# Patient Record
Sex: Female | Born: 1950 | Race: White | Hispanic: No | Marital: Married | Smoking: Never smoker
Health system: Southern US, Community
[De-identification: ages and names within clinical notes are randomized; demographics above are authoritative.]

## PROBLEM LIST (undated history)

## (undated) DIAGNOSIS — I10 Essential (primary) hypertension: Secondary | ICD-10-CM

## (undated) HISTORY — DX: Essential (primary) hypertension: I10

## (undated) HISTORY — PX: CARPAL TUNNEL RELEASE: SHX101

---

## 2005-10-07 ENCOUNTER — Ambulatory Visit: Payer: Self-pay | Admitting: Rheumatology

## 2006-11-24 ENCOUNTER — Ambulatory Visit: Payer: Self-pay | Admitting: Rheumatology

## 2006-12-13 ENCOUNTER — Ambulatory Visit: Payer: Self-pay | Admitting: Internal Medicine

## 2006-12-23 ENCOUNTER — Ambulatory Visit: Payer: Self-pay | Admitting: Internal Medicine

## 2007-01-13 ENCOUNTER — Ambulatory Visit: Payer: Self-pay | Admitting: Internal Medicine

## 2007-02-13 ENCOUNTER — Ambulatory Visit: Payer: Self-pay | Admitting: Internal Medicine

## 2007-03-13 ENCOUNTER — Ambulatory Visit: Payer: Self-pay | Admitting: Internal Medicine

## 2008-06-12 ENCOUNTER — Ambulatory Visit: Payer: Self-pay | Admitting: Internal Medicine

## 2008-06-12 IMAGING — CR DG HAND 2V*L*
1 series · 2 of 2 positions shown · non-contrast
Comparison: none

REASON FOR EXAM: RHEUMATOID ARTHRITIS, PLEASE FAX RESULTS TO [[email or web address]]
COMMENTS:

[Series 1: view not recorded · 0.17mm/px · 2 of 2 slices shown]
[im 1/2]
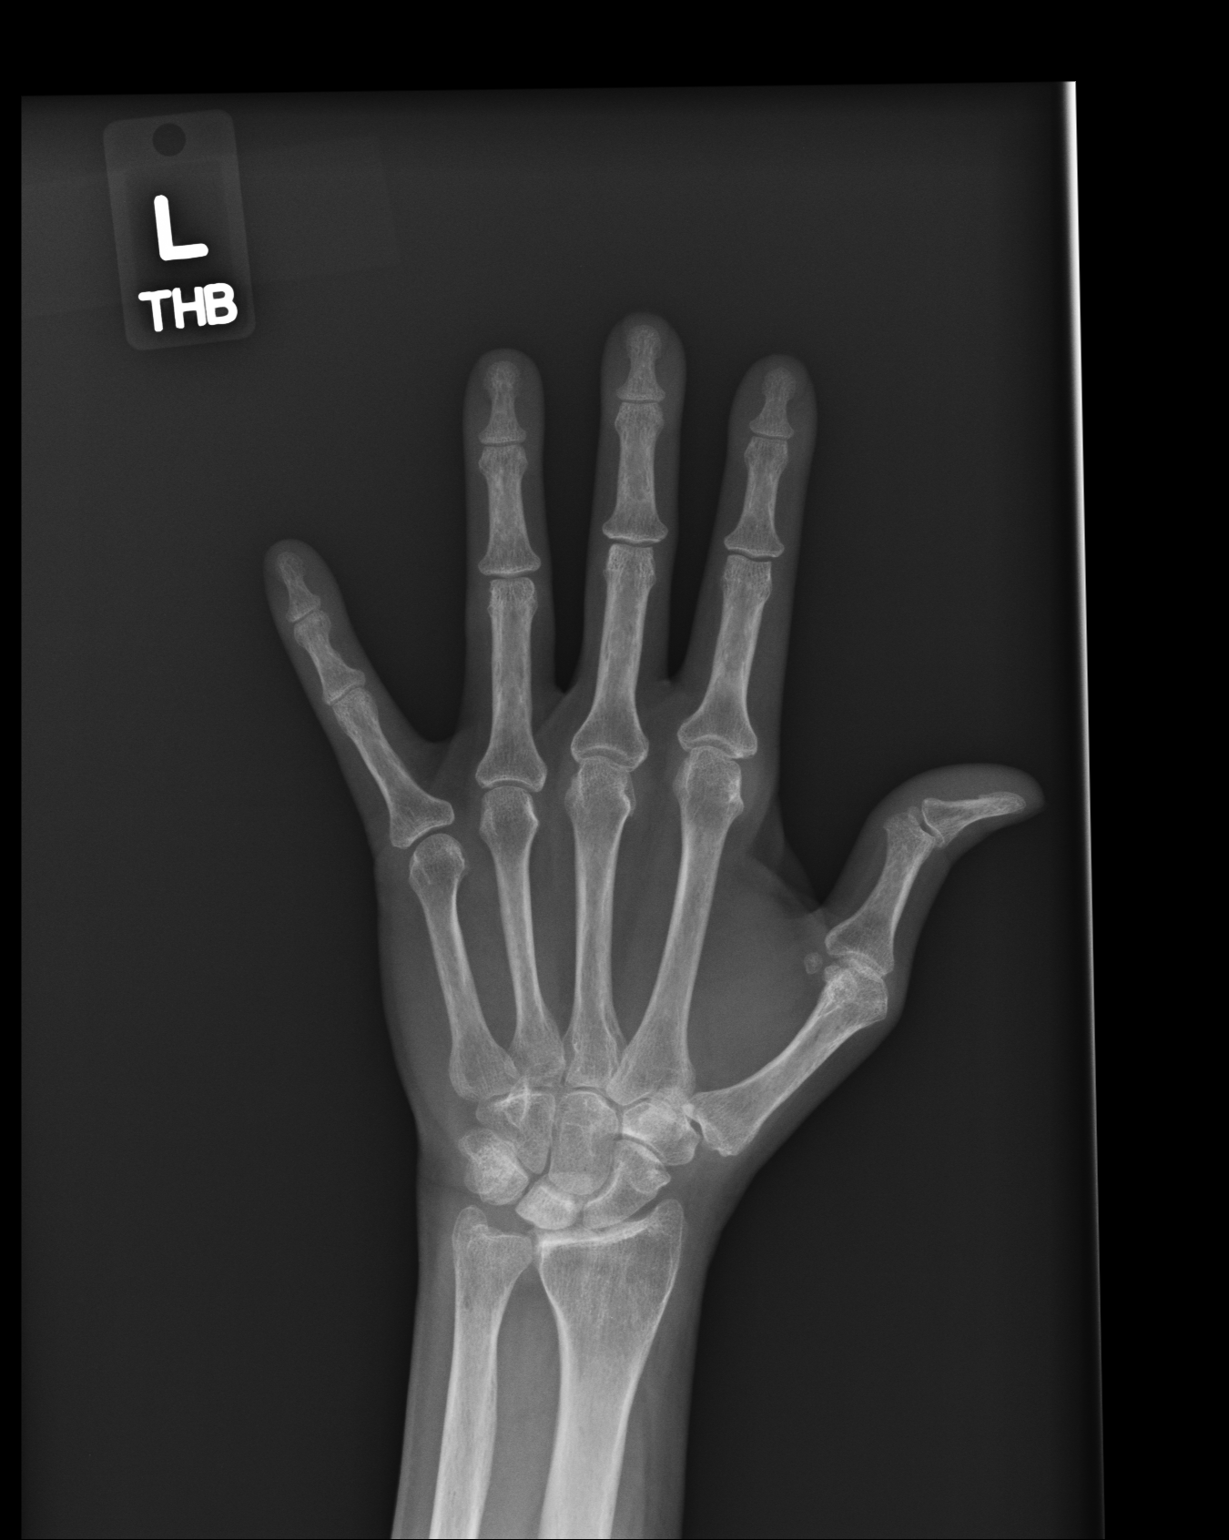
[im 2/2]
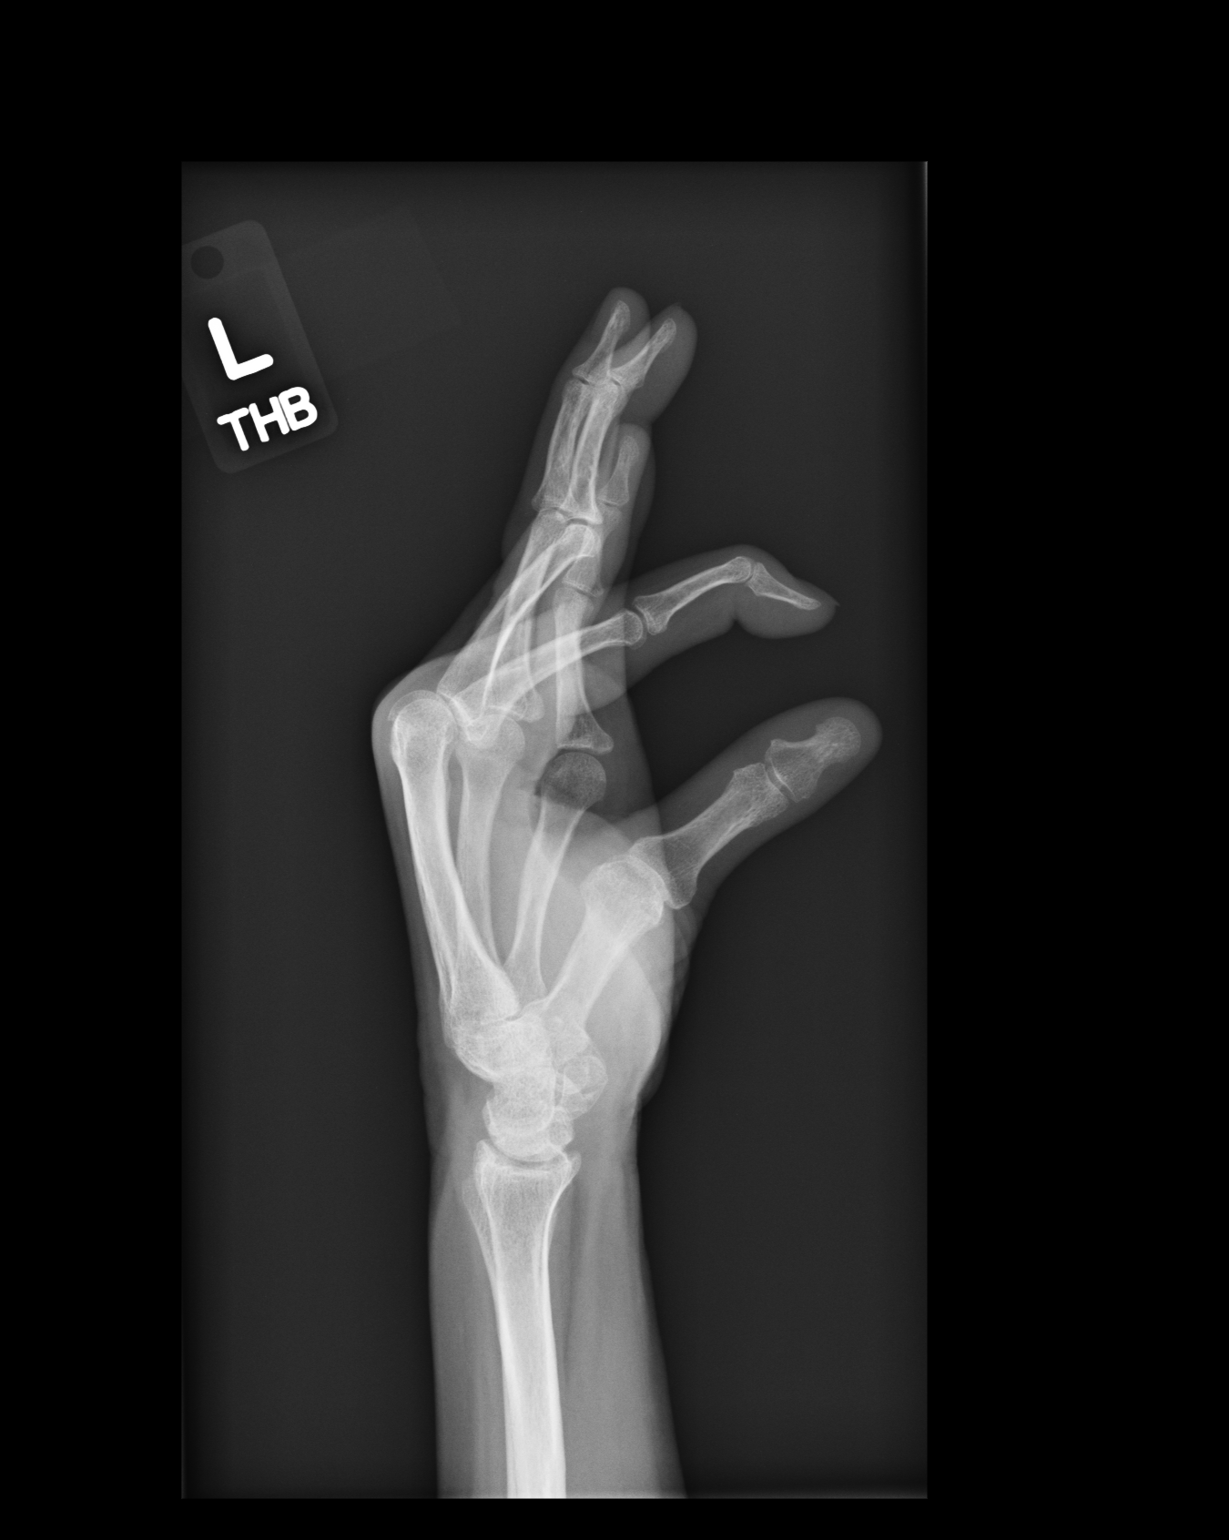

[2 of 2 positions shown; findings below may reference images not displayed]

PROCEDURE:     DXR - DXR HAND LT TWO VIEWS  - [DATE] [DATE]

RESULT:     The bones of the left hand are osteopenic. There is juxta
articular osteopenia involving principally the metacarpophalangeal joints.
There are mild degenerative interphalangeal joint changes noted. Mild
degenerative change of the first and second carpometacarpal joints is
present. The radiocarpal joints appear normal.
IMPRESSION: There is mild juxta articular osteopenia involving
principally the metacarpophalangeal joints. There is overall mild
osteopenia. I do not see bony erosive changes.

## 2008-10-12 HISTORY — PX: TOTAL HIP ARTHROPLASTY: SHX124

## 2008-12-05 ENCOUNTER — Encounter: Payer: Self-pay | Admitting: Orthopedic Surgery

## 2008-12-12 ENCOUNTER — Encounter: Payer: Self-pay | Admitting: Orthopedic Surgery

## 2009-01-12 ENCOUNTER — Encounter: Payer: Self-pay | Admitting: Orthopedic Surgery

## 2009-02-12 ENCOUNTER — Encounter: Payer: Self-pay | Admitting: Orthopedic Surgery

## 2009-06-27 ENCOUNTER — Ambulatory Visit: Payer: Self-pay | Admitting: Internal Medicine

## 2010-08-13 HISTORY — PX: FLAT FOOT RECONSTRUCTION-TAL GASTROC RECESSION: SHX6620

## 2010-11-25 ENCOUNTER — Ambulatory Visit: Payer: Self-pay | Admitting: Internal Medicine

## 2011-05-21 ENCOUNTER — Ambulatory Visit: Payer: Self-pay | Admitting: Internal Medicine

## 2011-08-12 ENCOUNTER — Ambulatory Visit: Payer: Self-pay | Admitting: Internal Medicine

## 2011-12-04 ENCOUNTER — Ambulatory Visit: Payer: Self-pay | Admitting: Internal Medicine

## 2012-01-20 DIAGNOSIS — M810 Age-related osteoporosis without current pathological fracture: Secondary | ICD-10-CM | POA: Insufficient documentation

## 2012-01-20 DIAGNOSIS — M069 Rheumatoid arthritis, unspecified: Secondary | ICD-10-CM | POA: Insufficient documentation

## 2012-01-20 DIAGNOSIS — I7300 Raynaud's syndrome without gangrene: Secondary | ICD-10-CM | POA: Insufficient documentation

## 2012-01-20 DIAGNOSIS — D649 Anemia, unspecified: Secondary | ICD-10-CM | POA: Insufficient documentation

## 2012-11-17 ENCOUNTER — Ambulatory Visit: Payer: Self-pay | Admitting: Internal Medicine

## 2012-11-17 DIAGNOSIS — I059 Rheumatic mitral valve disease, unspecified: Secondary | ICD-10-CM

## 2012-11-24 ENCOUNTER — Ambulatory Visit: Payer: Self-pay | Admitting: Internal Medicine

## 2013-07-31 ENCOUNTER — Ambulatory Visit: Payer: Self-pay | Admitting: Internal Medicine

## 2013-08-21 ENCOUNTER — Ambulatory Visit: Payer: Self-pay | Admitting: Internal Medicine

## 2014-06-12 ENCOUNTER — Other Ambulatory Visit: Payer: Self-pay | Admitting: Internal Medicine

## 2014-06-12 DIAGNOSIS — IMO0002 Reserved for concepts with insufficient information to code with codable children: Secondary | ICD-10-CM

## 2014-06-12 DIAGNOSIS — M810 Age-related osteoporosis without current pathological fracture: Secondary | ICD-10-CM

## 2014-06-22 ENCOUNTER — Ambulatory Visit: Payer: Self-pay

## 2014-06-28 ENCOUNTER — Ambulatory Visit
Admission: RE | Admit: 2014-06-28 | Discharge: 2014-06-28 | Disposition: A | Discharge status: Home / Self Care | Payer: Medicare Other | Source: Ambulatory Visit | Attending: Internal Medicine | Admitting: Internal Medicine

## 2014-06-28 DIAGNOSIS — IMO0002 Reserved for concepts with insufficient information to code with codable children: Secondary | ICD-10-CM

## 2014-06-28 DIAGNOSIS — I7300 Raynaud's syndrome without gangrene: Secondary | ICD-10-CM | POA: Insufficient documentation

## 2014-06-28 DIAGNOSIS — K224 Dyskinesia of esophagus: Secondary | ICD-10-CM | POA: Insufficient documentation

## 2014-06-28 DIAGNOSIS — I340 Nonrheumatic mitral (valve) insufficiency: Secondary | ICD-10-CM | POA: Diagnosis not present

## 2014-06-28 NOTE — Progress Notes (Signed)
*  PRELIMINARY RESULTS* Echocardiogram 2D Echocardiogram   has been performed.  Georgann Housekeeper Hege 06/28/2014, 10:35 AM

## 2014-08-07 ENCOUNTER — Ambulatory Visit (INDEPENDENT_AMBULATORY_CARE_PROVIDER_SITE_OTHER): Payer: Medicare Other | Admitting: Family Medicine

## 2014-08-07 ENCOUNTER — Encounter: Payer: Self-pay | Admitting: Family Medicine

## 2014-08-07 VITALS — BP 120/78 | Temp 98.4°F | Resp 17 | Ht 64.0 in | Wt 113.0 lb

## 2014-08-07 DIAGNOSIS — I10 Essential (primary) hypertension: Secondary | ICD-10-CM

## 2014-08-07 DIAGNOSIS — E039 Hypothyroidism, unspecified: Secondary | ICD-10-CM | POA: Diagnosis not present

## 2014-08-07 DIAGNOSIS — M349 Systemic sclerosis, unspecified: Secondary | ICD-10-CM | POA: Insufficient documentation

## 2014-08-07 DIAGNOSIS — K219 Gastro-esophageal reflux disease without esophagitis: Secondary | ICD-10-CM | POA: Insufficient documentation

## 2014-08-07 NOTE — Progress Notes (Signed)
Name: Susan Small   MRN: [id number]    DOB: 1950-05-14   Date:08/07/2014       Progress Note  Subjective  Chief Complaint  Chief Complaint  Patient presents with  . Follow-up    Blood Work  . Hypertension    Hypertension This is a chronic problem. The problem is controlled. Pertinent negatives include no chest pain, headaches or palpitations. Past treatments include calcium channel blockers. There is no history of angina, kidney disease, CAD/MI or CVA.  Thyroid Problem Presents for follow-up visit. Patient reports no cold intolerance, constipation, depressed mood, fatigue or palpitations. The symptoms have been stable. Past treatments include levothyroxine. There is no history of Graves' ophthalmopathy.      Past Medical History  Diagnosis Date  . Hypertension     Past Surgical History  Procedure Laterality Date  . Flat foot reconstruction-tal gastroc recession  08/2010  . Total hip arthroplasty Right 10/2008  . Carpal tunnel release Right     Family History  Problem Relation Age of Onset  . Hypertension Mother     History   Social History  . Marital Status: Married    Spouse Name: N/A  . Number of Children: N/A  . Years of Education: N/A   Occupational History  . Not on file.   Social History Main Topics  . Smoking status: Never Smoker   . Smokeless tobacco: Never Used  . Alcohol Use: No  . Drug Use: No  . Sexual Activity: No   Other Topics Concern  . Not on file   Social History Narrative  . No narrative on file     Current outpatient prescriptions:  .  amLODipine (NORVASC) 5 MG tablet, Take 5 mg by mouth daily., Disp: , Rfl: 0 .  hydroxychloroquine (PLAQUENIL) 200 MG tablet, Take 400 mg by mouth daily., Disp: , Rfl: 3 .  levothyroxine (SYNTHROID, LEVOTHROID) 88 MCG tablet, Take 88 mcg by mouth daily., Disp: , Rfl: 3  Allergies  Allergen Reactions  . Chlorzoxazone Rash  . Tetanus Toxoid Anxiety    Other Reaction: Other reaction  . Tetanus  Toxoids      Review of Systems  Constitutional: Negative for fatigue.  Cardiovascular: Negative for chest pain and palpitations.  Gastrointestinal: Negative for constipation.  Neurological: Negative for headaches.  Endo/Heme/Allergies: Negative for cold intolerance.      Objective  Filed Vitals:   08/07/14 0810  BP: 120/78  Temp: 98.4 F (36.9 C)  TempSrc: Oral  Resp: 17  Height: 5\' 4"  (1.626 m)  Weight: 113 lb (51.256 kg)    Physical Exam  Constitutional: She is well-developed, well-nourished, and in no distress.  Neck: No thyroid mass and no thyromegaly present.  Cardiovascular: Normal rate and regular rhythm.   Pulmonary/Chest: Effort normal and breath sounds normal.  Musculoskeletal: She exhibits no edema.  Nursing note and vitals reviewed.     Assessment & Plan 1. Essential hypertension Blood pressure is well controlled on amlodipine 5 mg daily. Continue present management. Laboratory follow-up obtained. - Lipid panel - CBC w/Diff  2. Hypothyroidism, unspecified hypothyroidism type Recheck TSH and free T4 and adjust medication accordingly. - TSH - T4, free   Shavaun Osterloh Asad A. [facility] 08/07/2014 8:25 AM

## 2014-08-08 LAB — CBC WITH DIFFERENTIAL/PLATELET
Basophils Absolute: 0 10*3/uL (ref 0.0–0.2)
Basos: 1 %
EOS (ABSOLUTE): 0.2 10*3/uL (ref 0.0–0.4)
Eos: 5 %
Hematocrit: 38.7 % (ref 34.0–46.6)
Hemoglobin: 12.7 g/dL (ref 11.1–15.9)
IMMATURE GRANS (ABS): 0 10*3/uL (ref 0.0–0.1)
Immature Granulocytes: 0 %
LYMPHS: 30 %
Lymphocytes Absolute: 1.1 10*3/uL (ref 0.7–3.1)
MCH: 30.5 pg (ref 26.6–33.0)
MCHC: 32.8 g/dL (ref 31.5–35.7)
MCV: 93 fL (ref 79–97)
Monocytes Absolute: 0.6 10*3/uL (ref 0.1–0.9)
Monocytes: 17 %
NEUTROS ABS: 1.8 10*3/uL (ref 1.4–7.0)
Neutrophils: 47 %
PLATELETS: 220 10*3/uL (ref 150–379)
RBC: 4.17 x10E6/uL (ref 3.77–5.28)
RDW: 14.3 % (ref 12.3–15.4)
WBC: 3.7 10*3/uL (ref 3.4–10.8)

## 2014-08-08 LAB — LIPID PANEL
CHOLESTEROL TOTAL: 149 mg/dL (ref 100–199)
Chol/HDL Ratio: 3.4 ratio units (ref 0.0–4.4)
HDL: 44 mg/dL (ref >39)
LDL Calculated: 91 mg/dL (ref 0–99)
TRIGLYCERIDES: 72 mg/dL (ref 0–149)
VLDL CHOLESTEROL CAL: 14 mg/dL (ref 5–40)

## 2014-08-08 LAB — T4, FREE: Free T4: 1.74 ng/dL (ref 0.82–1.77)

## 2014-08-08 LAB — TSH: TSH: 0.158 u[IU]/mL — ABNORMAL LOW (ref 0.450–4.500)

## 2014-08-10 ENCOUNTER — Other Ambulatory Visit: Payer: Self-pay | Admitting: Family Medicine

## 2014-08-10 MED ORDER — LEVOTHYROXINE SODIUM 75 MCG PO TABS: 75.0000 ug | ORAL_TABLET | Freq: Every day | ORAL | Status: DC

## 2014-08-10 NOTE — Telephone Encounter (Signed)
Medication has been filled and sent to [facility]

## 2014-09-05 ENCOUNTER — Other Ambulatory Visit: Payer: Self-pay | Admitting: Family Medicine

## 2014-09-07 ENCOUNTER — Telehealth: Payer: Self-pay | Admitting: Family Medicine

## 2014-09-07 MED ORDER — LEVOTHYROXINE SODIUM 75 MCG PO TABS: 75.0000 ug | ORAL_TABLET | Freq: Every day | ORAL | Status: DC

## 2014-09-07 NOTE — Telephone Encounter (Signed)
Pt would like a all back about her thyroid meds.

## 2014-09-07 NOTE — Telephone Encounter (Signed)
Levothyroxine 75 MCG has been refilled and sent to [facility]

## 2014-10-03 ENCOUNTER — Telehealth: Payer: Self-pay | Admitting: Family Medicine

## 2014-10-03 NOTE — Telephone Encounter (Signed)
PT ISD ASKING FOR A LAB ORDER FOR THYROIDS TO SEE IF THEY ARE CORRECT SINCE THE DR CHANGED THE STRENGTH OF HER MEDS. IS ASKING THAT THIS ORDER IF COULD BE SENT TO [facility]

## 2014-10-05 MED ORDER — LEVOTHYROXINE SODIUM 75 MCG PO TABS: 75.0000 ug | ORAL_TABLET | Freq: Every day | ORAL | Status: DC

## 2014-10-05 NOTE — Telephone Encounter (Signed)
Medication has been refilled and sent to [facility] [address] 

## 2014-10-07 NOTE — Telephone Encounter (Signed)
Please schedule patient for an appointment to check TSH and free T4 levels.

## 2014-10-08 NOTE — Telephone Encounter (Signed)
PT IS ASKING WHY SHE IS HAVING TO HAVE AN OFFICE VISIT FOR THIS. SHE SAID THAT SHE HAD A PANEL DONE 6  WKS AGO. PLEASE CALL HER SHE IS REFUSING FOR ME TO MAKE HER ANOTHER APPT TILL SHE TALKS TO SOMEONE.

## 2014-10-31 ENCOUNTER — Telehealth: Payer: Self-pay | Admitting: Family Medicine

## 2014-10-31 DIAGNOSIS — E039 Hypothyroidism, unspecified: Secondary | ICD-10-CM

## 2014-10-31 NOTE — Telephone Encounter (Signed)
PT IS NEEDING LAB ORDERS TO GET THYROID CHECKED. SHE HAS ONLY ENOUGH TO LAST 1 WK AND YOU ALL WANTED TO MAKE SURE SHE IS ON THE RIGHT STRENGTH. ALSO WANTS THIS FAXED TO [facility] ON [address]. SHE IS AROUND THE CORNER FROM THEM.

## 2014-10-31 NOTE — Telephone Encounter (Signed)
Spoke with patient and she will come to office on tomorrow morning 11/01/2014 to pick up lab orders

## 2014-11-01 NOTE — Telephone Encounter (Signed)
Lab orders are ready for patient to pick up and she has been notified

## 2014-11-02 LAB — T4, FREE: FREE T4: 1.39 ng/dL (ref 0.82–1.77)

## 2014-11-02 LAB — TSH: TSH: 0.509 u[IU]/mL (ref 0.450–4.500)

## 2014-11-06 ENCOUNTER — Other Ambulatory Visit: Payer: Self-pay | Admitting: Family Medicine

## 2014-12-03 ENCOUNTER — Other Ambulatory Visit: Payer: Self-pay | Admitting: Family Medicine

## 2014-12-05 ENCOUNTER — Other Ambulatory Visit: Payer: Self-pay | Admitting: Family Medicine

## 2014-12-05 ENCOUNTER — Telehealth: Payer: Self-pay | Admitting: Family Medicine

## 2014-12-05 MED ORDER — AMLODIPINE BESYLATE 5 MG PO TABS: 5.0000 mg | ORAL_TABLET | Freq: Every day | ORAL | Status: DC

## 2014-12-05 NOTE — Telephone Encounter (Signed)
Requesting refill on amlodipine and levothyroxine. Requesting that it be sent to [facility] and that you please send in enough of both medications to last until her next appointment in January. I did inform her that the levothyroxine was sent on the 21st however the pharmacy did not receive it

## 2014-12-05 NOTE — Telephone Encounter (Signed)
Medication has been refilled and sent to [facility] [address] 

## 2014-12-27 ENCOUNTER — Ambulatory Visit (INDEPENDENT_AMBULATORY_CARE_PROVIDER_SITE_OTHER): Payer: Medicare Other | Admitting: Family Medicine

## 2014-12-27 ENCOUNTER — Encounter: Payer: Self-pay | Admitting: Family Medicine

## 2014-12-27 VITALS — BP 120/76 | Temp 97.9°F | Ht 64.0 in | Wt 114.1 lb

## 2014-12-27 DIAGNOSIS — E039 Hypothyroidism, unspecified: Secondary | ICD-10-CM

## 2014-12-27 DIAGNOSIS — I10 Essential (primary) hypertension: Secondary | ICD-10-CM | POA: Diagnosis not present

## 2014-12-27 MED ORDER — AMLODIPINE BESYLATE 5 MG PO TABS: 5.0000 mg | ORAL_TABLET | Freq: Every day | ORAL | Status: DC

## 2014-12-27 MED ORDER — LEVOTHYROXINE SODIUM 75 MCG PO TABS: 75.0000 ug | ORAL_TABLET | Freq: Every day | ORAL | Status: DC

## 2014-12-27 NOTE — Progress Notes (Signed)
Name: Susan Small   MRN: [id number]    DOB: 1950-04-07   Date:12/27/2014       Progress Note  Subjective  Chief Complaint  Chief Complaint  Patient presents with  . Medication Refill    amlodipine 5 mg / levothyroxine  . Hypertension  . Gastroesophageal Reflux    Hypertension This is a chronic problem. The problem is controlled. Pertinent negatives include no blurred vision, chest pain, headaches, palpitations or shortness of breath. Past treatments include calcium channel blockers. Hypertensive end-organ damage includes a thyroid problem. There is no history of angina, kidney disease, CAD/MI or CVA.  Thyroid Problem Presents for follow-up visit. Patient reports no cold intolerance, constipation, depressed mood, fatigue, hair loss, hoarse voice or palpitations. The symptoms have been stable. Past treatments include levothyroxine. There is no history of Graves' ophthalmopathy.     Past Medical History  Diagnosis Date  . Hypertension     Past Surgical History  Procedure Laterality Date  . Flat foot reconstruction-tal gastroc recession  08/2010  . Total hip arthroplasty Right 10/2008  . Carpal tunnel release Right     Family History  Problem Relation Age of Onset  . Hypertension Mother     Social History   Social History  . Marital Status: Married    Spouse Name: N/A  . Number of Children: N/A  . Years of Education: N/A   Occupational History  . Not on file.   Social History Main Topics  . Smoking status: Never Smoker   . Smokeless tobacco: Never Used  . Alcohol Use: No  . Drug Use: No  . Sexual Activity: No   Other Topics Concern  . Not on file   Social History Narrative     Current outpatient prescriptions:  .  amLODipine (NORVASC) 5 MG tablet, Take 1 tablet (5 mg total) by mouth daily., Disp: 30 tablet, Rfl: 0 .  hydroxychloroquine (PLAQUENIL) 200 MG tablet, Take 400 mg by mouth daily., Disp: , Rfl: 3 .  levothyroxine (SYNTHROID, LEVOTHROID) 75  MCG tablet, TAKE 1 TABLET (75 MCG TOTAL) BY MOUTH DAILY., Disp: 30 tablet, Rfl: 0  Allergies  Allergen Reactions  . Chlorzoxazone Rash  . Tetanus Toxoid Anxiety and Other (See Comments)    Other Reaction: Other reaction Other Reaction: Other reaction  . Tetanus Toxoids      Review of Systems  Constitutional: Negative for fatigue.  HENT: Negative for hoarse voice.   Eyes: Negative for blurred vision.  Respiratory: Negative for shortness of breath.   Cardiovascular: Negative for chest pain and palpitations.  Gastrointestinal: Negative for constipation.  Neurological: Negative for headaches.  Endo/Heme/Allergies: Negative for cold intolerance.     Objective  Filed Vitals:   12/27/14 0841  BP: 120/76  Temp: 97.9 F (36.6 C)  TempSrc: Oral  Height: 5\' 4"  (1.626 m)  Weight: 114 lb 1.6 oz (51.755 kg)    Physical Exam  Constitutional: She is well-developed, well-nourished, and in no distress.  Neck: No thyroid mass and no thyromegaly present.  Cardiovascular: Normal rate and regular rhythm.   Pulmonary/Chest: Effort normal and breath sounds normal.  Musculoskeletal: She exhibits no edema.  Nursing note and vitals reviewed.    Recent Results (from the past 2160 hour(s))  T4, free     Status: None   Collection Time: 11/01/14  9:54 AM  Result Value Ref Range   Free T4 1.39 0.82 - 1.77 ng/dL  TSH     Status: None   Collection  Time: 11/01/14  9:54 AM  Result Value Ref Range   TSH 0.509 0.450 - 4.500 uIU/mL     Assessment & Plan  1. Essential hypertension  - amLODipine (NORVASC) 5 MG tablet; Take 1 tablet (5 mg total) by mouth daily.  Dispense: 90 tablet; Refill: 1  2. Hypothyroidism, unspecified hypothyroidism type  - levothyroxine (SYNTHROID, LEVOTHROID) 75 MCG tablet; Take 1 tablet (75 mcg total) by mouth daily before breakfast.  Dispense: 90 tablet; Refill: 1   Cymone Yeske Asad A[facility] 12/27/2014 8:47 AM

## 2015-02-08 ENCOUNTER — Ambulatory Visit: Payer: Medicare Other | Admitting: Family Medicine

## 2015-06-20 ENCOUNTER — Other Ambulatory Visit (HOSPITAL_COMMUNITY): Payer: Self-pay | Admitting: Internal Medicine

## 2015-06-20 ENCOUNTER — Other Ambulatory Visit (HOSPITAL_COMMUNITY): Payer: Self-pay | Admitting: Respiratory Therapy

## 2015-06-20 ENCOUNTER — Telehealth: Payer: Self-pay | Admitting: Family Medicine

## 2015-06-20 DIAGNOSIS — I272 Pulmonary hypertension, unspecified: Secondary | ICD-10-CM

## 2015-06-20 DIAGNOSIS — I10 Essential (primary) hypertension: Secondary | ICD-10-CM

## 2015-06-20 DIAGNOSIS — J849 Interstitial pulmonary disease, unspecified: Secondary | ICD-10-CM

## 2015-06-20 DIAGNOSIS — E039 Hypothyroidism, unspecified: Secondary | ICD-10-CM

## 2015-06-20 MED ORDER — LEVOTHYROXINE SODIUM 75 MCG PO TABS: 75.0000 ug | ORAL_TABLET | Freq: Every day | ORAL | Status: DC

## 2015-06-20 MED ORDER — AMLODIPINE BESYLATE 5 MG PO TABS: 5.0000 mg | ORAL_TABLET | Freq: Every day | ORAL | Status: DC

## 2015-06-20 NOTE — Telephone Encounter (Signed)
Have appointment for 07-11-15 but is needing a refill on amlodipine and levothyroxin. Please send to [facility] [address]

## 2015-06-20 NOTE — Telephone Encounter (Signed)
Prescription for amlodipine and levothyroxine has been sent to her pharmacy

## 2015-06-21 NOTE — Telephone Encounter (Signed)
She picked up her prescriptions on yesterday.

## 2015-06-25 ENCOUNTER — Other Ambulatory Visit (HOSPITAL_COMMUNITY): Payer: Medicare Other

## 2015-06-26 ENCOUNTER — Other Ambulatory Visit: Payer: Self-pay | Admitting: Internal Medicine

## 2015-06-26 DIAGNOSIS — J849 Interstitial pulmonary disease, unspecified: Secondary | ICD-10-CM

## 2015-07-02 ENCOUNTER — Encounter (HOSPITAL_COMMUNITY): Payer: Medicare Other

## 2015-07-09 ENCOUNTER — Ambulatory Visit (HOSPITAL_COMMUNITY): Payer: Medicare Other

## 2015-07-09 ENCOUNTER — Ambulatory Visit
Admission: RE | Admit: 2015-07-09 | Discharge: 2015-07-09 | Disposition: A | Discharge status: Home / Self Care | Payer: Medicare Other | Source: Ambulatory Visit | Attending: Internal Medicine | Admitting: Internal Medicine

## 2015-07-09 DIAGNOSIS — I272 Other secondary pulmonary hypertension: Secondary | ICD-10-CM | POA: Diagnosis not present

## 2015-07-09 DIAGNOSIS — I340 Nonrheumatic mitral (valve) insufficiency: Secondary | ICD-10-CM | POA: Diagnosis not present

## 2015-07-09 DIAGNOSIS — J849 Interstitial pulmonary disease, unspecified: Secondary | ICD-10-CM

## 2015-07-09 LAB — ECHOCARDIOGRAM COMPLETE
AV peak Index: 2.06
AVAREAVTI: 3.17 cm2
AVPG: 5 mmHg
AVPKVEL: 109 cm/s
Ao pk vel: 1.01 m/s
CHL CUP MV DEC (S): 280
E decel time: 280 msec
E/e' ratio: 8.12
FS: 44 % (ref 28–44)
IVS/LV PW RATIO, ED: 0.96
LA ID, A-P, ES: 36 mm
LA vol index: 41.5 mL/m2
LADIAMINDEX: 2.34 cm/m2
LAVOL: 63.9 mL
LAVOLA4C: 59.8 mL
LEFT ATRIUM END SYS DIAM: 36 mm
LV E/e' medial: 8.12
LV E/e'average: 8.12
LV TDI E'MEDIAL: 5.11
LVELAT: 10.1 cm/s
LVOT area: 3.14 cm2
LVOT diameter: 20 mm
LVOT peak vel: 110 cm/s
MV pk E vel: 82 m/s
MVPG: 3 mmHg
MVPKAVEL: 76.6 m/s
PW: 12 mm — AB (ref 0.6–1.1)
RV TAPSE: 17.4 mm
TDI e' lateral: 10.1

## 2015-07-09 NOTE — Progress Notes (Signed)
*  PRELIMINARY RESULTS* Echocardiogram 2D Echocardiogram has been performed.  Susan Small 07/09/2015, 10:21 AM

## 2015-07-11 ENCOUNTER — Ambulatory Visit (INDEPENDENT_AMBULATORY_CARE_PROVIDER_SITE_OTHER): Payer: Medicare Other | Admitting: Family Medicine

## 2015-07-11 ENCOUNTER — Encounter: Payer: Self-pay | Admitting: Family Medicine

## 2015-07-11 VITALS — BP 132/70 | HR 89 | Temp 98.4°F | Resp 16 | Ht 64.0 in | Wt 107.8 lb

## 2015-07-11 DIAGNOSIS — I10 Essential (primary) hypertension: Secondary | ICD-10-CM | POA: Diagnosis not present

## 2015-07-11 DIAGNOSIS — E039 Hypothyroidism, unspecified: Secondary | ICD-10-CM | POA: Diagnosis not present

## 2015-07-11 DIAGNOSIS — Z13220 Encounter for screening for lipoid disorders: Secondary | ICD-10-CM

## 2015-07-11 DIAGNOSIS — E785 Hyperlipidemia, unspecified: Secondary | ICD-10-CM | POA: Insufficient documentation

## 2015-07-11 NOTE — Progress Notes (Signed)
Name: Susan Small   MRN: [id number]    DOB: 1950-10-14   Date:07/11/2015       Progress Note  Subjective  Chief Complaint  Chief Complaint  Patient presents with  . Follow-up  . Hyperlipidemia  . Hypothyroidism    Hypertension This is a chronic problem. The problem is controlled. Associated symptoms include shortness of breath (Has scleroderma). Pertinent negatives include no chest pain, headaches or palpitations. Past treatments include calcium channel blockers. Hypertensive end-organ damage includes a thyroid problem.  Thyroid Problem Presents for follow-up visit. Symptoms include cold intolerance. Patient reports no depressed mood, dry skin, fatigue or palpitations. Past treatments include levothyroxine.     Past Medical History  Diagnosis Date  . Hypertension     Past Surgical History  Procedure Laterality Date  . Flat foot reconstruction-tal gastroc recession  08/2010  . Total hip arthroplasty Right 10/2008  . Carpal tunnel release Right     Family History  Problem Relation Age of Onset  . Hypertension Mother     Social History   Social History  . Marital Status: Married    Spouse Name: N/A  . Number of Children: N/A  . Years of Education: N/A   Occupational History  . Not on file.   Social History Main Topics  . Smoking status: Never Smoker   . Smokeless tobacco: Never Used  . Alcohol Use: No  . Drug Use: No  . Sexual Activity: No   Other Topics Concern  . Not on file   Social History Narrative     Current outpatient prescriptions:  .  amLODipine (NORVASC) 5 MG tablet, Take 1 tablet (5 mg total) by mouth daily., Disp: 90 tablet, Rfl: 1 .  hydroxychloroquine (PLAQUENIL) 200 MG tablet, Take 400 mg by mouth daily., Disp: , Rfl: 3 .  levothyroxine (SYNTHROID, LEVOTHROID) 75 MCG tablet, Take 1 tablet (75 mcg total) by mouth daily before breakfast., Disp: 90 tablet, Rfl: 1  Allergies  Allergen Reactions  . Chlorzoxazone Rash  . Tetanus Toxoid  Anxiety and Other (See Comments)    Other Reaction: Other reaction Other Reaction: Other reaction  . Tetanus Toxoids      Review of Systems  Constitutional: Negative for fatigue.  Respiratory: Positive for shortness of breath (Has scleroderma).   Cardiovascular: Negative for chest pain and palpitations.  Neurological: Negative for headaches.  Endo/Heme/Allergies: Positive for cold intolerance.     Objective  Filed Vitals:   07/11/15 0905  BP: 132/70  Pulse: 89  Temp: 98.4 F (36.9 C)  TempSrc: Oral  Resp: 16  Height: 5\' 4"  (1.626 m)  Weight: 107 lb 12.8 oz (48.898 kg)  SpO2: 98%    Physical Exam  Constitutional: She is oriented to person, place, and time and well-developed, well-nourished, and in no distress.  HENT:  Head: Normocephalic and atraumatic.  Neck: No thyroid mass and no thyromegaly present.  Musculoskeletal:       Right ankle: She exhibits no swelling.       Left ankle: She exhibits no swelling.  Neurological: She is alert and oriented to person, place, and time.  Psychiatric: Mood, memory, affect and judgment normal.  Nursing note and vitals reviewed.      Assessment & Plan  1. Essential hypertension BP stable and controlled - Comprehensive Metabolic Panel (CMET)  2. Hypothyroidism, unspecified hypothyroidism type  - TSH  3. Screening for lipid disorders  - Lipid Profile  Deolinda Frid [facility] 07/11/2015 9:11 AM

## 2015-07-12 LAB — COMPREHENSIVE METABOLIC PANEL
ALBUMIN: 4.1 g/dL (ref 3.6–4.8)
ALK PHOS: 56 IU/L (ref 39–117)
ALT: 7 IU/L (ref 0–32)
AST: 30 IU/L (ref 0–40)
Albumin/Globulin Ratio: 0.9 — ABNORMAL LOW (ref 1.2–2.2)
BILIRUBIN TOTAL: 0.5 mg/dL (ref 0.0–1.2)
BUN / CREAT RATIO: 12 (ref 12–28)
BUN: 11 mg/dL (ref 8–27)
CHLORIDE: 102 mmol/L (ref 96–106)
CO2: 25 mmol/L (ref 18–29)
Calcium: 9.6 mg/dL (ref 8.7–10.3)
Creatinine, Ser: 0.94 mg/dL (ref 0.57–1.00)
GFR calc Af Amer: 74 mL/min/{1.73_m2} (ref >59)
GFR calc non Af Amer: 64 mL/min/{1.73_m2} (ref >59)
GLOBULIN, TOTAL: 4.6 g/dL — AB (ref 1.5–4.5)
GLUCOSE: 87 mg/dL (ref 65–99)
Potassium: 4.2 mmol/L (ref 3.5–5.2)
SODIUM: 141 mmol/L (ref 134–144)
Total Protein: 8.7 g/dL — ABNORMAL HIGH (ref 6.0–8.5)

## 2015-07-12 LAB — LIPID PANEL
Chol/HDL Ratio: 3.3 ratio units (ref 0.0–4.4)
Cholesterol, Total: 160 mg/dL (ref 100–199)
HDL: 49 mg/dL (ref >39)
LDL Calculated: 100 mg/dL — ABNORMAL HIGH (ref 0–99)
Triglycerides: 53 mg/dL (ref 0–149)
VLDL CHOLESTEROL CAL: 11 mg/dL (ref 5–40)

## 2015-07-12 LAB — TSH: TSH: 1.33 u[IU]/mL (ref 0.450–4.500)

## 2015-08-19 ENCOUNTER — Other Ambulatory Visit: Payer: Self-pay | Admitting: Internal Medicine

## 2015-08-19 DIAGNOSIS — M818 Other osteoporosis without current pathological fracture: Secondary | ICD-10-CM

## 2015-09-11 ENCOUNTER — Ambulatory Visit
Admission: RE | Admit: 2015-09-11 | Discharge: 2015-09-11 | Disposition: A | Discharge status: Home / Self Care | Payer: Medicare Other | Source: Ambulatory Visit | Attending: Internal Medicine | Admitting: Internal Medicine

## 2015-09-11 DIAGNOSIS — M818 Other osteoporosis without current pathological fracture: Secondary | ICD-10-CM

## 2015-09-11 DIAGNOSIS — M8580 Other specified disorders of bone density and structure, unspecified site: Secondary | ICD-10-CM | POA: Insufficient documentation

## 2015-09-11 DIAGNOSIS — Z13820 Encounter for screening for osteoporosis: Secondary | ICD-10-CM | POA: Insufficient documentation

## 2015-10-22 ENCOUNTER — Telehealth: Payer: Self-pay | Admitting: Family Medicine

## 2015-10-22 NOTE — Telephone Encounter (Signed)
Would like to know the last dosage of the levothyroxine. She called the pharmacy to request a refill on the 75 mcg but she think it should be the 88 mcg. Please clarify.

## 2015-10-22 NOTE — Telephone Encounter (Signed)
According to her chart, she should be on levothyroxine 75 g daily

## 2015-10-23 NOTE — Telephone Encounter (Signed)
Pt informed and scheduled medication refill appointment for 01/23/15

## 2015-12-27 ENCOUNTER — Other Ambulatory Visit: Payer: Self-pay | Admitting: Family Medicine

## 2015-12-27 DIAGNOSIS — I10 Essential (primary) hypertension: Secondary | ICD-10-CM

## 2016-01-23 ENCOUNTER — Telehealth: Payer: Self-pay | Admitting: Family Medicine

## 2016-01-23 ENCOUNTER — Ambulatory Visit: Payer: Medicare Other | Admitting: Family Medicine

## 2016-01-23 DIAGNOSIS — E039 Hypothyroidism, unspecified: Secondary | ICD-10-CM

## 2016-01-23 MED ORDER — LEVOTHYROXINE SODIUM 75 MCG PO TABS: 75.0000 ug | ORAL_TABLET | Freq: Every day | ORAL | 1 refills | Status: DC

## 2016-01-23 NOTE — Telephone Encounter (Signed)
PT IS NEEDING REFILL ON HER THYROID MEDICATION. [facility] [address]. SHE HAS AN APPT ON JAN 25TH. PT IS TOTALLY OUT

## 2016-01-23 NOTE — Telephone Encounter (Signed)
Prescription for Synthroid sent to pharmacy   

## 2016-02-06 ENCOUNTER — Ambulatory Visit (INDEPENDENT_AMBULATORY_CARE_PROVIDER_SITE_OTHER): Payer: Medicare Other | Admitting: Family Medicine

## 2016-02-06 ENCOUNTER — Encounter: Payer: Self-pay | Admitting: Family Medicine

## 2016-02-06 VITALS — BP 140/82 | HR 85 | Temp 98.2°F | Resp 16 | Ht 64.0 in | Wt 109.3 lb

## 2016-02-06 DIAGNOSIS — I10 Essential (primary) hypertension: Secondary | ICD-10-CM | POA: Diagnosis not present

## 2016-02-06 DIAGNOSIS — E039 Hypothyroidism, unspecified: Secondary | ICD-10-CM | POA: Diagnosis not present

## 2016-02-06 DIAGNOSIS — Z13220 Encounter for screening for lipoid disorders: Secondary | ICD-10-CM | POA: Diagnosis not present

## 2016-02-06 DIAGNOSIS — M349 Systemic sclerosis, unspecified: Secondary | ICD-10-CM | POA: Diagnosis not present

## 2016-02-06 LAB — COMPLETE METABOLIC PANEL WITH GFR
ALBUMIN: 3.9 g/dL (ref 3.6–5.1)
ALK PHOS: 48 U/L (ref 33–130)
ALT: 11 U/L (ref 6–29)
AST: 31 U/L (ref 10–35)
BILIRUBIN TOTAL: 0.5 mg/dL (ref 0.2–1.2)
BUN: 12 mg/dL (ref 7–25)
CO2: 24 mmol/L (ref 20–31)
CREATININE: 0.75 mg/dL (ref 0.50–0.99)
Calcium: 9.5 mg/dL (ref 8.6–10.4)
Chloride: 105 mmol/L (ref 98–110)
GFR, EST NON AFRICAN AMERICAN: 85 mL/min (ref >=60)
GLUCOSE: 86 mg/dL (ref 65–99)
Potassium: 4.4 mmol/L (ref 3.5–5.3)
Sodium: 139 mmol/L (ref 135–146)
TOTAL PROTEIN: 8.8 g/dL — AB (ref 6.1–8.1)

## 2016-02-06 LAB — LIPID PANEL
Cholesterol: 153 mg/dL (ref <200)
HDL: 44 mg/dL — AB (ref >50)
LDL Cholesterol: 97 mg/dL (ref <100)
TRIGLYCERIDES: 58 mg/dL (ref <150)
Total CHOL/HDL Ratio: 3.5 Ratio (ref <5.0)
VLDL: 12 mg/dL (ref <30)

## 2016-02-06 LAB — TSH: TSH: 1.84 mIU/L

## 2016-02-06 MED ORDER — AMLODIPINE BESYLATE 5 MG PO TABS: 5.0000 mg | ORAL_TABLET | Freq: Every day | ORAL | 1 refills | Status: DC

## 2016-02-06 MED ORDER — LEVOTHYROXINE SODIUM 75 MCG PO TABS: 75.0000 ug | ORAL_TABLET | Freq: Every day | ORAL | 1 refills | Status: DC

## 2016-02-06 NOTE — Progress Notes (Signed)
Name: Susan Small   MRN: [id number]    DOB: 1950/04/29   Date:02/06/2016       Progress Note  Subjective  Chief Complaint  Chief Complaint  Patient presents with  . Hypertension    folloow up, medication refills  . Hypothyroidism    Hypertension  This is a chronic problem. The problem is unchanged. The problem is controlled. Associated symptoms include shortness of breath (Has scleroderma). Pertinent negatives include no blurred vision, chest pain, headaches or palpitations. Past treatments include calcium channel blockers. There is no history of kidney disease, CAD/MI or CVA. Identifiable causes of hypertension include a thyroid problem.  Thyroid Problem  Presents for follow-up visit. Symptoms include cold intolerance. Patient reports no constipation, depressed mood, dry skin, fatigue, hair loss or palpitations. The symptoms have been stable. Past treatments include levothyroxine.     Past Medical History:  Diagnosis Date  . Hypertension     Past Surgical History:  Procedure Laterality Date  . CARPAL TUNNEL RELEASE Right   . FLAT FOOT RECONSTRUCTION-TAL GASTROC RECESSION  08/2010  . TOTAL HIP ARTHROPLASTY Right 10/2008    Family History  Problem Relation Age of Onset  . Hypertension Mother     Social History   Social History  . Marital status: Married    Spouse name: N/A  . Number of children: N/A  . Years of education: N/A   Occupational History  . Not on file.   Social History Main Topics  . Smoking status: Never Smoker  . Smokeless tobacco: Never Used  . Alcohol use No  . Drug use: No  . Sexual activity: No   Other Topics Concern  . Not on file   Social History Narrative  . No narrative on file     Current Outpatient Prescriptions:  .  amLODipine (NORVASC) 5 MG tablet, TAKE 1 TABLET (5 MG TOTAL) BY MOUTH DAILY., Disp: 90 tablet, Rfl: 1 .  hydroxychloroquine (PLAQUENIL) 200 MG tablet, Take 400 mg by mouth daily., Disp: , Rfl: 3 .  levothyroxine  (SYNTHROID, LEVOTHROID) 75 MCG tablet, Take 1 tablet (75 mcg total) by mouth daily before breakfast., Disp: 30 tablet, Rfl: 1  Allergies  Allergen Reactions  . Chlorzoxazone Rash  . Tetanus Toxoid Anxiety and Other (See Comments)    Other Reaction: Other reaction Other Reaction: Other reaction  . Tetanus Toxoids      Review of Systems  Constitutional: Negative for fatigue.  Eyes: Negative for blurred vision.  Respiratory: Positive for shortness of breath (Has scleroderma).   Cardiovascular: Negative for chest pain and palpitations.  Gastrointestinal: Negative for constipation.  Neurological: Negative for headaches.  Endo/Heme/Allergies: Positive for cold intolerance.    Objective  Vitals:   02/06/16 0900  BP: 140/82  Pulse: 85  Resp: 16  Temp: 98.2 F (36.8 C)  TempSrc: Oral  SpO2: 94%  Weight: 109 lb 4.8 oz (49.6 kg)  Height: 5\' 4"  (1.626 m)    Physical Exam  Constitutional: She is oriented to person, place, and time and well-developed, well-nourished, and in no distress.  HENT:  Head: Normocephalic and atraumatic.  Neck: No thyroid mass and no thyromegaly present.  Cardiovascular: Normal rate, regular rhythm and normal heart sounds.   No murmur heard. Pulmonary/Chest: Effort normal and breath sounds normal. She has no wheezes.  Musculoskeletal: She exhibits no edema.       Right ankle: She exhibits no swelling.       Left ankle: She exhibits no swelling.  Neurological:  She is alert and oriented to person, place, and time.  Psychiatric: Mood, memory, affect and judgment normal.  Nursing note and vitals reviewed.     Assessment & Plan  1. Adult hypothyroidism Stable, obtain TSH levothyroxine (SYNTHROID, LEVOTHROID) 75 MCG tablet; Take 1 tablet (75 mcg total) by mouth daily before breakfast.  Dispense: 90 tablet; Refill: 1 - TSH  2. Essential hypertension BP stable on present and hypertensive therapy - amLODipine (NORVASC) 5 MG tablet; Take 1 tablet (5  mg total) by mouth daily.  Dispense: 90 tablet; Refill: 1 - COMPLETE METABOLIC PANEL WITH GFR  3. Scleroderma (HCC) being followed by rheumatology, continues on Plaquenil  4. Screening for lipid disorders  - Lipid Profile   Susan Small [facility] 02/06/2016 9:11 AM

## 2016-02-13 ENCOUNTER — Telehealth: Payer: Self-pay | Admitting: Family Medicine

## 2016-02-13 NOTE — Telephone Encounter (Signed)
Patient has been notified of lab results  

## 2016-02-13 NOTE — Telephone Encounter (Signed)
Pt would like lab results.  

## 2016-05-04 ENCOUNTER — Encounter: Payer: Self-pay | Admitting: Family Medicine

## 2016-05-04 ENCOUNTER — Ambulatory Visit (INDEPENDENT_AMBULATORY_CARE_PROVIDER_SITE_OTHER): Payer: Medicare Other | Admitting: Family Medicine

## 2016-05-04 VITALS — BP 136/82 | HR 64 | Temp 98.2°F | Resp 16 | Ht 64.0 in | Wt 108.9 lb

## 2016-05-04 DIAGNOSIS — Z1211 Encounter for screening for malignant neoplasm of colon: Secondary | ICD-10-CM | POA: Diagnosis not present

## 2016-05-04 DIAGNOSIS — J0100 Acute maxillary sinusitis, unspecified: Secondary | ICD-10-CM | POA: Diagnosis not present

## 2016-05-04 MED ORDER — AMOXICILLIN 875 MG PO TABS: 875.0000 mg | ORAL_TABLET | Freq: Two times a day (BID) | ORAL | 0 refills | Status: AC

## 2016-05-04 NOTE — Progress Notes (Addendum)
Name: Susan Small   MRN: [id number]    DOB: 05-28-1950   Date:05/04/2016       Progress Note  Subjective  Chief Complaint  Chief Complaint  Patient presents with  . Sinusitis    HPI  Pt has 9 day history of nasal congestion and sinus pain and pressure with yellow nasal drainage and hoarse voice; congested non-productive cough.  No fatigue, fevers/chills, NVD, shortness of breath or chest pain.  Has taken Tylenol for pain with good relief; has not used any allergy or decongestant medications.  Has been able to continue going to the gym three times a week.  Patient Active Problem List   Diagnosis Date Noted  . Screening for lipid disorders 07/11/2015  . Adult hypothyroidism 08/07/2014  . Hypertension 08/07/2014  . Scleroderma (HCC) 08/07/2014  . Acid reflux 08/07/2014    Social History  Substance Use Topics  . Smoking status: Never Smoker  . Smokeless tobacco: Never Used  . Alcohol use No     Current Outpatient Prescriptions:  .  amLODipine (NORVASC) 5 MG tablet, Take 1 tablet (5 mg total) by mouth daily., Disp: 90 tablet, Rfl: 1 .  hydroxychloroquine (PLAQUENIL) 200 MG tablet, Take 400 mg by mouth daily., Disp: , Rfl: 3 .  levothyroxine (SYNTHROID, LEVOTHROID) 75 MCG tablet, Take 1 tablet (75 mcg total) by mouth daily before breakfast., Disp: 90 tablet, Rfl: 1  Allergies  Allergen Reactions  . Chlorzoxazone Rash  . Tetanus Toxoid Anxiety and Other (See Comments)    Other Reaction: Other reaction Other Reaction: Other reaction  . Tetanus Toxoids    ROS  Constitutional: Negative for fever or weight change.  Respiratory: Positive for cough and negative shortness of breath.   HEENT: See HPI Cardiovascular: Negative for chest pain or palpitations.  Gastrointestinal: Negative for abdominal pain, no bowel changes.  Musculoskeletal: Negative for gait problem or joint swelling.  Skin: Negative for rash.  Neurological: Negative for dizziness or headache.  No other  specific complaints in a complete review of systems (except as listed in HPI above).  Objective  Vitals:   05/04/16 1336  BP: 136/82  Pulse: 64  Resp: 16  Temp: 98.2 F (36.8 C)  TempSrc: Oral  SpO2: 96%  Weight: 108 lb 14.4 oz (49.4 kg)  Height: 5\' 4"  (1.626 m)    Body mass index is 18.69 kg/m.  Nursing Note and Vital Signs reviewed.  Physical Exam  Constitutional: Patient appears well-developed and well-nourished.  No distress.  HEENT: head atraumatic, normocephalic, pupils equal and reactive to light, EOM's intact, TM's without erythema or bulging, maxillary sinus pain on palptaion; no frontal sinus pain on palpation, neck supple without lymphadenopathy, oropharynx pink and moist without exudate. Hoarse voice noted. Cardiovascular: Normal rate, regular rhythm, S1/S2 present.  No murmur or rub heard. No BLE edema. Pulmonary/Chest: Effort normal and breath sounds clear. No respiratory distress or retractions. Abdominal: Soft and non-tender, bowel sounds present x4 quadrants. Psychiatric: Patient has a normal mood and affect. behavior is normal. Judgment and thought content normal.  Recent Results (from the past 2160 hour(s))  Lipid Profile     Status: Abnormal   Collection Time: 02/06/16  9:30 AM  Result Value Ref Range   Cholesterol 153 <200 mg/dL   Triglycerides 58 02/08/16 mg/dL   HDL 44 (L) <761 mg/dL   Total CHOL/HDL Ratio 3.5 <5.0 Ratio   VLDL 12 <30 mg/dL   LDL Cholesterol 97 >95 mg/dL  COMPLETE METABOLIC PANEL WITH GFR  Status: Abnormal   Collection Time: 02/06/16  9:30 AM  Result Value Ref Range   Sodium 139 135 - 146 mmol/L   Potassium 4.4 3.5 - 5.3 mmol/L   Chloride 105 98 - 110 mmol/L   CO2 24 20 - 31 mmol/L   Glucose, Bld 86 65 - 99 mg/dL   BUN 12 7 - 25 mg/dL   Creat 9.50 9.32 - 6.71 mg/dL    Comment:   For patients > or = 65 years of age: The upper reference limit for Creatinine is approximately 13% higher for people identified  as African-American.      Total Bilirubin 0.5 0.2 - 1.2 mg/dL   Alkaline Phosphatase 48 33 - 130 U/L   AST 31 10 - 35 U/L   ALT 11 6 - 29 U/L   Total Protein 8.8 (H) 6.1 - 8.1 g/dL   Albumin 3.9 3.6 - 5.1 g/dL   Calcium 9.5 8.6 - 24.5 mg/dL   GFR, Est African American >89 >=60 mL/min   GFR, Est Non African American 85 >=60 mL/min  TSH     Status: None   Collection Time: 02/06/16  9:30 AM  Result Value Ref Range   TSH 1.84 mIU/L    Comment:   Reference Range   > or = 20 Years  0.40-4.50   Pregnancy Range First trimester  0.26-2.66 Second trimester 0.55-2.73 Third trimester  0.43-2.91        Assessment & Plan  1. Acute non-recurrent maxillary sinusitis  - amoxicillin (AMOXIL) 875 MG tablet; Take 1 tablet (875 mg total) by mouth 2 (two) times daily.  Dispense: 20 tablet; Refill: 0 -Discussed use of OTC Flonase pt declines. -Pt declines tessalon or other cough medication at this time, will use cough drops. -Pt may continue to take Tylenol as needed - not exceeding 3000mg /day. -Patient to call in 3-5 days if no improvement. -Red flags and when to present for emergency care including chest pain, shortness of breath, new/worsening/not-improving symptoms, or fever >101.4 reviewed with patient at time of visit. Follow up and care instructions discussed and provided in AVS.  2. Screening for colon cancer - Cologuard ordered. -Cologuard orders placed and pamphlet given.  -Reviewed Health Maintenance: Mammogram - will obtain records, had this done in 2018. -Pt declines Hep C and HIV screening, TDAP vaccine (states is allergic), and to schedule Pap.  I have reviewed this encounter including the documentation in this note and/or discussed this patient with the 2019, FNP, NP-C. I am certifying that I agree with the content of this note as supervising physician.  Deboraha Sprang, MD [facility] 05/04/2016, 5:00 PM

## 2016-05-04 NOTE — Patient Instructions (Signed)
Please continue to use cough drops as needed. Drink plenty of fluids. Activity as tolerated. Sinusitis, Adult Sinusitis is soreness and inflammation of your sinuses. Sinuses are hollow spaces in the bones around your face. They are located:  Around your eyes.  In the middle of your forehead.  Behind your nose.  In your cheekbones. Your sinuses and nasal passages are lined with a stringy fluid (mucus). Mucus normally drains out of your sinuses. When your nasal tissues get inflamed or swollen, the mucus can get trapped or blocked so air cannot flow through your sinuses. This lets bacteria, viruses, and funguses grow, and that leads to infection. Follow these instructions at home: Medicines   Take, use, or apply over-the-counter and prescription medicines only as told by your doctor. These may include nasal sprays.  If you were prescribed an antibiotic medicine, take it as told by your doctor. Do not stop taking the antibiotic even if you start to feel better. Hydrate and Humidify   Drink enough water to keep your pee (urine) clear or pale yellow.  Use a cool mist humidifier to keep the humidity level in your home above 50%.  Breathe in steam for 10-15 minutes, 3-4 times a day or as told by your doctor. You can do this in the bathroom while a hot shower is running.  Try not to spend time in cool or dry air. Rest   Rest as much as possible.  Sleep with your head raised (elevated).  Make sure to get enough sleep each night. General instructions   Put a warm, moist washcloth on your face 3-4 times a day or as told by your doctor. This will help with discomfort.  Wash your hands often with soap and water. If there is no soap and water, use hand sanitizer.  Do not smoke. Avoid being around people who are smoking (secondhand smoke).  Keep all follow-up visits as told by your doctor. This is important. Contact a doctor if:  You have a fever.  Your symptoms get worse.  Your  symptoms do not get better within 10 days. Get help right away if:  You have a very bad headache.  You cannot stop throwing up (vomiting).  You have pain or swelling around your face or eyes.  You have trouble seeing.  You feel confused.  Your neck is stiff.  You have trouble breathing. This information is not intended to replace advice given to you by your health care provider. Make sure you discuss any questions you have with your health care provider. Document Released: 06/17/2007 Document Revised: 08/25/2015 Document Reviewed: 10/24/2014 Elsevier Interactive Patient Education  2017 [employer].

## 2016-05-09 ENCOUNTER — Encounter: Payer: Self-pay | Admitting: Family Medicine

## 2016-05-10 LAB — COLOGUARD: Cologuard: NEGATIVE

## 2016-08-05 ENCOUNTER — Ambulatory Visit (INDEPENDENT_AMBULATORY_CARE_PROVIDER_SITE_OTHER): Payer: Medicare Other | Admitting: Family Medicine

## 2016-08-05 ENCOUNTER — Encounter: Payer: Self-pay | Admitting: Family Medicine

## 2016-08-05 VITALS — BP 130/75 | HR 78 | Temp 97.7°F | Resp 16 | Ht 64.0 in | Wt 106.0 lb

## 2016-08-05 DIAGNOSIS — E039 Hypothyroidism, unspecified: Secondary | ICD-10-CM | POA: Diagnosis not present

## 2016-08-05 DIAGNOSIS — K219 Gastro-esophageal reflux disease without esophagitis: Secondary | ICD-10-CM | POA: Diagnosis not present

## 2016-08-05 DIAGNOSIS — I10 Essential (primary) hypertension: Secondary | ICD-10-CM | POA: Diagnosis not present

## 2016-08-05 LAB — TSH: TSH: 0.39 mIU/L — ABNORMAL LOW

## 2016-08-05 MED ORDER — LEVOTHYROXINE SODIUM 75 MCG PO TABS: 75.0000 ug | ORAL_TABLET | Freq: Every day | ORAL | 1 refills | Status: DC

## 2016-08-05 MED ORDER — AMLODIPINE BESYLATE 5 MG PO TABS: 5.0000 mg | ORAL_TABLET | Freq: Every day | ORAL | 1 refills | Status: DC

## 2016-08-05 NOTE — Progress Notes (Signed)
Name: Susan Small   MRN: [id number]    DOB: 1950/03/29   Date:08/05/2016       Progress Note  Subjective  Chief Complaint  Chief Complaint  Patient presents with  . Follow-up    6 mo  . Hypothyroidism  . Gastroesophageal Reflux  . Hypertension  . Medication Refill    Levothyroxine    Gastroesophageal Reflux  She complains of heartburn. She reports no dysphagia. This is a chronic problem. The problem has been unchanged. The symptoms are aggravated by certain foods (greasy and fried foods). She has tried an antacid (TUMS) for the symptoms. Past procedures do not include an EGD.  Hypertension  This is a chronic problem. The problem is unchanged. The problem is controlled. Pertinent negatives include no blurred vision, palpitations or shortness of breath (Has scleroderma). Past treatments include calcium channel blockers. There is no history of kidney disease, CAD/MI or CVA. Identifiable causes of hypertension include a thyroid problem.  Thyroid Problem  Presents for follow-up visit. Symptoms include cold intolerance. Patient reports no constipation, depressed mood, dry skin, hair loss or palpitations. The symptoms have been stable. Past treatments include levothyroxine.     Past Medical History:  Diagnosis Date  . Hypertension     Past Surgical History:  Procedure Laterality Date  . CARPAL TUNNEL RELEASE Right   . FLAT FOOT RECONSTRUCTION-TAL GASTROC RECESSION  08/2010  . TOTAL HIP ARTHROPLASTY Right 10/2008    Family History  Problem Relation Age of Onset  . Hypertension Mother     Social History   Social History  . Marital status: Married    Spouse name: N/A  . Number of children: N/A  . Years of education: N/A   Occupational History  . Not on file.   Social History Main Topics  . Smoking status: Never Smoker  . Smokeless tobacco: Never Used  . Alcohol use No  . Drug use: No  . Sexual activity: No   Other Topics Concern  . Not on file   Social History  Narrative  . No narrative on file     Current Outpatient Prescriptions:  .  amLODipine (NORVASC) 5 MG tablet, Take 1 tablet (5 mg total) by mouth daily., Disp: 90 tablet, Rfl: 1 .  hydroxychloroquine (PLAQUENIL) 200 MG tablet, Take 400 mg by mouth daily., Disp: , Rfl: 3 .  levothyroxine (SYNTHROID, LEVOTHROID) 75 MCG tablet, Take 1 tablet (75 mcg total) by mouth daily before breakfast., Disp: 90 tablet, Rfl: 1  Allergies  Allergen Reactions  . Chlorzoxazone Rash  . Tetanus Toxoid Anxiety and Other (See Comments)    Other Reaction: Other reaction Other Reaction: Other reaction  . Tetanus Toxoids      Review of Systems  Eyes: Negative for blurred vision.  Respiratory: Negative for shortness of breath (Has scleroderma).   Cardiovascular: Negative for palpitations.  Gastrointestinal: Positive for heartburn. Negative for constipation and dysphagia.  Endo/Heme/Allergies: Positive for cold intolerance.     Objective  Vitals:   08/05/16 0819  BP: 130/75  Pulse: 78  Resp: 16  Temp: 97.7 F (36.5 C)  TempSrc: Oral  SpO2: 95%  Weight: 106 lb (48.1 kg)  Height: 5\' 4"  (1.626 m)    Physical Exam  Constitutional: She is oriented to person, place, and time and well-developed, well-nourished, and in no distress.  HENT:  Head: Normocephalic and atraumatic.  Neck: No thyroid mass and no thyromegaly present.  Cardiovascular: Normal rate, regular rhythm and normal heart sounds.  No murmur heard. Pulmonary/Chest: Effort normal and breath sounds normal. She has no wheezes.  Abdominal: Soft. Bowel sounds are normal. There is no tenderness.  Musculoskeletal: She exhibits no edema.       Right ankle: She exhibits no swelling.       Left ankle: She exhibits no swelling.  Neurological: She is alert and oriented to person, place, and time.  Psychiatric: Mood, memory, affect and judgment normal.  Nursing note and vitals reviewed.       Assessment & Plan  1. Adult  hypothyroidism Obtain TSH and adjust levothyroxine accordingly - levothyroxine (SYNTHROID, LEVOTHROID) 75 MCG tablet; Take 1 tablet (75 mcg total) by mouth daily before breakfast.  Dispense: 90 tablet; Refill: 1 - TSH  2. Essential hypertension Stable on present antihypertensive treatment - amLODipine (NORVASC) 5 MG tablet; Take 1 tablet (5 mg total) by mouth daily.  Dispense: 90 tablet; Refill: 1  3. Gastroesophageal reflux disease, esophagitis presence not specified Schendt has refused to be started on a higher intensity antireflux medicine such as Zantac or Prilosec, is controlling her symptoms with diet and Tums as needed, reassess in 6 months   Yoko Mcgahee Asad A. [facility] 08/05/2016 8:32 AM

## 2016-08-12 ENCOUNTER — Telehealth: Payer: Self-pay

## 2016-08-12 MED ORDER — LEVOTHYROXINE SODIUM 50 MCG PO TABS: 50.0000 ug | ORAL_TABLET | Freq: Every day | ORAL | 0 refills | Status: DC

## 2016-08-12 NOTE — Telephone Encounter (Signed)
Patient has been notified of lab results and that a new prescription of levothyroxine 50 every morning has been sent to [facility] [address] per Dr. Sherryll Burger, patient has verbalized understanding

## 2016-08-31 ENCOUNTER — Telehealth: Payer: Self-pay | Admitting: Family Medicine

## 2016-08-31 DIAGNOSIS — E039 Hypothyroidism, unspecified: Secondary | ICD-10-CM

## 2016-08-31 NOTE — Telephone Encounter (Signed)
PT SAID THAT YOU WANTED HER TO COME BACK TO HAVE LABS FOR THYROID CHECKED. PLEASE PLACE THE ORDERS IN HER CHART SO WE CAN PRINT THEM OR HAVE THEM UP FRONT SO I CAN LET HER KNOW WHEN TO COME IN.

## 2016-09-02 NOTE — Telephone Encounter (Signed)
Orders for TSH have been entered into patient's chart

## 2016-09-02 NOTE — Addendum Note (Signed)
Addended bySherryll Burger, Sheilyn Boehlke ASAD A on: 09/02/2016 06:42 PM   Modules accepted: Orders

## 2016-09-03 NOTE — Telephone Encounter (Signed)
Pt.notified

## 2016-09-05 LAB — TSH: TSH: 4.83 m[IU]/L — AB

## 2016-09-09 ENCOUNTER — Other Ambulatory Visit: Payer: Self-pay | Admitting: Family Medicine

## 2016-09-10 DIAGNOSIS — I2721 Secondary pulmonary arterial hypertension: Secondary | ICD-10-CM | POA: Insufficient documentation

## 2016-09-15 ENCOUNTER — Other Ambulatory Visit: Payer: Self-pay | Admitting: Internal Medicine

## 2016-09-15 ENCOUNTER — Other Ambulatory Visit: Payer: Self-pay

## 2016-09-15 DIAGNOSIS — R7989 Other specified abnormal findings of blood chemistry: Secondary | ICD-10-CM

## 2016-09-15 DIAGNOSIS — J849 Interstitial pulmonary disease, unspecified: Secondary | ICD-10-CM

## 2016-09-16 ENCOUNTER — Other Ambulatory Visit: Payer: Self-pay | Admitting: Internal Medicine

## 2016-09-16 DIAGNOSIS — J849 Interstitial pulmonary disease, unspecified: Secondary | ICD-10-CM

## 2016-09-23 LAB — TSH: TSH: 18.06 mIU/L — ABNORMAL HIGH (ref 0.40–4.50)

## 2016-09-24 ENCOUNTER — Ambulatory Visit
Admission: RE | Admit: 2016-09-24 | Discharge: 2016-09-24 | Disposition: A | Discharge status: Home / Self Care | Payer: Medicare Other | Source: Ambulatory Visit | Attending: Internal Medicine | Admitting: Internal Medicine

## 2016-09-24 ENCOUNTER — Ambulatory Visit (HOSPITAL_COMMUNITY): Payer: Medicare Other

## 2016-09-24 ENCOUNTER — Other Ambulatory Visit: Payer: Self-pay | Admitting: Family Medicine

## 2016-09-24 ENCOUNTER — Telehealth: Payer: Self-pay

## 2016-09-24 DIAGNOSIS — I340 Nonrheumatic mitral (valve) insufficiency: Secondary | ICD-10-CM | POA: Diagnosis not present

## 2016-09-24 DIAGNOSIS — I10 Essential (primary) hypertension: Secondary | ICD-10-CM

## 2016-09-24 DIAGNOSIS — J849 Interstitial pulmonary disease, unspecified: Secondary | ICD-10-CM | POA: Insufficient documentation

## 2016-09-24 MED ORDER — LEVOTHYROXINE SODIUM 75 MCG PO TABS: 75.0000 ug | ORAL_TABLET | ORAL | 0 refills | Status: DC

## 2016-09-24 NOTE — Progress Notes (Signed)
*  PRELIMINARY RESULTS* Echocardiogram 2D Echocardiogram has been performed.  Susan Small 09/24/2016, 10:19 AM

## 2016-09-24 NOTE — Telephone Encounter (Signed)
Patient has been notified of lab results and a new prescription for levothyroxine 75 evry morning has been sent to [facility] [address] Per Dr. Sherryll Burger, patient has been notified

## 2016-10-19 ENCOUNTER — Ambulatory Visit (INDEPENDENT_AMBULATORY_CARE_PROVIDER_SITE_OTHER): Payer: Medicare Other | Admitting: Family Medicine

## 2016-10-19 ENCOUNTER — Other Ambulatory Visit: Payer: Self-pay | Admitting: Family Medicine

## 2016-10-19 ENCOUNTER — Encounter: Payer: Self-pay | Admitting: Family Medicine

## 2016-10-19 VITALS — BP 134/80 | HR 72 | Resp 16 | Ht 64.0 in | Wt 106.8 lb

## 2016-10-19 DIAGNOSIS — Z79899 Other long term (current) drug therapy: Secondary | ICD-10-CM

## 2016-10-19 DIAGNOSIS — M069 Rheumatoid arthritis, unspecified: Secondary | ICD-10-CM | POA: Diagnosis not present

## 2016-10-19 DIAGNOSIS — E039 Hypothyroidism, unspecified: Secondary | ICD-10-CM

## 2016-10-19 DIAGNOSIS — E782 Mixed hyperlipidemia: Secondary | ICD-10-CM

## 2016-10-19 DIAGNOSIS — Z23 Encounter for immunization: Secondary | ICD-10-CM | POA: Diagnosis not present

## 2016-10-19 DIAGNOSIS — M349 Systemic sclerosis, unspecified: Secondary | ICD-10-CM

## 2016-10-19 DIAGNOSIS — I2721 Secondary pulmonary arterial hypertension: Secondary | ICD-10-CM | POA: Diagnosis not present

## 2016-10-19 DIAGNOSIS — Z7689 Persons encountering health services in other specified circumstances: Secondary | ICD-10-CM | POA: Diagnosis not present

## 2016-10-19 DIAGNOSIS — J302 Other seasonal allergic rhinitis: Secondary | ICD-10-CM | POA: Diagnosis not present

## 2016-10-19 DIAGNOSIS — E559 Vitamin D deficiency, unspecified: Secondary | ICD-10-CM | POA: Insufficient documentation

## 2016-10-19 DIAGNOSIS — I10 Essential (primary) hypertension: Secondary | ICD-10-CM

## 2016-10-19 LAB — T4, FREE: Free T4: 1.3 ng/dL (ref 0.8–1.8)

## 2016-10-19 LAB — TSH: TSH: 1.53 m[IU]/L (ref 0.40–4.50)

## 2016-10-19 NOTE — Assessment & Plan Note (Signed)
Now stable and seems controlled on current levothyroxine, had difficulty with control and dose change over past several months - Recent TSH trend normal to 0.38 > 0.481 > 18 - Chronic history had been stable >20 years  Plan: 1. Continue current Levothyroxine daily - has enough rx 90 day 2. Check TSH, Free T4 today 3. If labs normal - refill current Levo daily 4. Re-check TSH, Free T4 in 4 months with annual then can space to q 6 months

## 2016-10-19 NOTE — Assessment & Plan Note (Addendum)
Stable chronic problem, followed by Rheumatology Complicated with RA, pulmonary fibrosis, pulmonary HTN On plaquenil Lab monitoring, ECHO, and PFTs per Rheumatology

## 2016-10-19 NOTE — Assessment & Plan Note (Signed)
Stable, chronic problem, secondary to scleroderma pulmonary fibrosis Followed by Rheumatology with yearly ECHO, last 09/2016

## 2016-10-19 NOTE — Progress Notes (Signed)
Subjective:    Patient ID: Susan Small, female    DOB: 08/20/1950, 66 y.o.   MRN: [id number]  Susan Small is a 66 y.o. female presenting on 10/19/2016 for Establish Care (Transfer for Dr. Sherryll Burger ) and Hypothyroidism (last check 09/23/16)  Here as new patient to establish care with new PCP. Transfer from [employer]. Her husband is a patient of mine at our office [facility].  HPI  Specialists: Rheumatology - Dr Enedina Finner [facility], in [place]) - scleroderma. Additionally states that Rheumatoogy checks most blood work LFTs, CBC among other markers yearly in beginning of year  She is requesting future labs in 1-02/2017 normally PCP checks basic chemistry thyroid and choelsterol  HYPOTHYROIDISM - Reports chronic history for >20 years with thyroid problem, had been on levothyroxine for while overall has been on stable dose for many years. Recently had problem controlling thyroid. She did have this initially as well many years ago. 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 She has been taking Plaquenil and stable on this dose currently. - States she has had chronic problem with fibrosis involving her lungs affecting pulmonary HTN, followed with ECHOcardiogram yearly by Rheum, last done 09/2016 mostly unremarkable  CHRONIC HTN Reports no new concerns. Not  checking BP at home, but does rarely check at pharmacy, usually SBP 140s, occasional has high reading at other doctors office Current Meds - Amlodipine 5mg    Reports good compliance, took meds today. Tolerating well, w/o complaints. Lifestyle: - Diet: Improves diet, tries to eat healthier, veggies, organic, smaller portions - Exercise: Regular exercise 3 x weekly walking >1 mile, and senior citizen exercise class at gym  History of Seasonal Allergies - reports yearly problem, no recent problems, no medicines currently  Health Maintenance: - Due for Flu Shot, will receive today, counseling on benefits, due for high dose vaccine, has not received recent years - Reportedly UTD on Pneumovax, outside vaccine record from 01/2010 with Pneumovax, and also received Prevnar-13 by report from [facility] [address] [place]) in [address], states she declines booster for Pneumovax at this time, will follow-up with Rheum  Depression screen [facility] 2/9 08/05/2016 07/11/2015 12/27/2014  Decreased Interest 0 0 0  Down, Depressed, Hopeless 0 0 0  PHQ - 2 Score 0 0 0    Past Medical History:  Diagnosis Date  . Hypertension    Past Surgical History:  Procedure Laterality Date  . CARPAL TUNNEL RELEASE Right   . FLAT FOOT RECONSTRUCTION-TAL GASTROC RECESSION  08/2010  . TOTAL HIP ARTHROPLASTY Right 10/2008   Social History   Social History  . Marital status: Married    Spouse name: Iram Heimann  . Number of children: N/A  . Years of education: N/A   Occupational History  . Retired     Previously worked in Estate agent office for many years   Social History Main Topics  .  Smoking status: Never Smoker  . Smokeless tobacco: Never Used  . Alcohol use No  . Drug use: No  . Sexual activity: No   Other Topics Concern  . Not on file   Social History Narrative  . No narrative on file   Family History  Problem Relation Age of Onset  . Hypertension Mother    Current Outpatient  Prescriptions on File Prior to Visit  Medication Sig  . amLODipine (NORVASC) 5 MG tablet Take 1 tablet (5 mg total) by mouth daily.  . hydroxychloroquine (PLAQUENIL) 200 MG tablet Take 400 mg by mouth daily.  Marland Kitchen levothyroxine (SYNTHROID, LEVOTHROID) 75 MCG tablet Take 1 tablet (75 mcg total) by mouth every morning.   No current facility-administered medications on file prior to visit.     Review of Systems  Constitutional: Negative for activity change, appetite change, chills, diaphoresis, fatigue (improved now on higher levothyroxine), fever and unexpected weight change.  HENT: Negative for congestion, hearing loss and sinus pressure.   Eyes: Negative for visual disturbance.  Respiratory: Negative for apnea, cough, chest tightness, shortness of breath and wheezing.   Cardiovascular: Negative for chest pain, palpitations and leg swelling.  Gastrointestinal: Negative for abdominal pain, anal bleeding, blood in stool, constipation, diarrhea, nausea and vomiting.  Endocrine: Negative for cold intolerance and polyuria.  Genitourinary: Negative for difficulty urinating, dysuria, frequency and hematuria.  Musculoskeletal: Negative for arthralgias, back pain and neck pain.  Skin: Negative for rash.  Allergic/Immunologic: Negative for environmental allergies.  Neurological: Negative for dizziness, weakness, light-headedness, numbness and headaches.  Hematological: Negative for adenopathy.  Psychiatric/Behavioral: Negative for behavioral problems, dysphoric mood and sleep disturbance. The patient is not nervous/anxious.    Per HPI unless specifically indicated above     Objective:    BP 134/80 (BP Location: Left Arm, Cuff Size: Normal)   Pulse 72   Resp 16   Ht 5\' 4"  (1.626 m)   Wt 106 lb 12.8 oz (48.4 kg)   BMI 18.33 kg/m   Wt Readings from Last 3 Encounters:  10/19/16 106 lb 12.8 oz (48.4 kg)  08/05/16 106 lb (48.1 kg)  05/04/16 108 lb 14.4 oz (49.4 kg)    Physical Exam    Constitutional: She is oriented to person, place, and time. She appears well-developed and well-nourished. No distress.  Well-appearing, comfortable, cooperative  HENT:  Head: Normocephalic and atraumatic.  Mouth/Throat: Oropharynx is clear and moist.  Eyes: Conjunctivae are normal. Right eye exhibits no discharge. Left eye exhibits no discharge.  Neck: Normal range of motion. Neck supple. No thyromegaly present.  Cardiovascular: Normal rate, regular rhythm, normal heart sounds and intact distal pulses.   No murmur heard. Pulmonary/Chest: Effort normal and breath sounds normal. No respiratory distress. She has no wheezes. She has no rales.  Musculoskeletal: Normal range of motion. She exhibits no edema.  Lymphadenopathy:    She has no cervical adenopathy.  Neurological: She is alert and oriented to person, place, and time.  Skin: Skin is warm and dry. No rash noted. She is not diaphoretic. No erythema.  Psychiatric: She has a normal mood and affect. Her behavior is normal.  Well groomed, good eye contact, normal speech and thoughts  Nursing note and vitals reviewed.     Results for orders placed or performed in visit on 09/15/16  TSH  Result Value Ref Range   TSH 18.06 (H) 0.40 - 4.50 mIU/L      Assessment & Plan:   Problem List Items Addressed This Visit  Adult hypothyroidism - Primary    Now stable and seems controlled on current levothyroxine, had difficulty with control and dose change over past several months - Recent TSH trend normal to 0.38 > 0.481 > 18 - Chronic history had been stable >20 years  Plan: 1. Continue current Levothyroxine daily - has enough rx 90 day 2. Check TSH, Free T4 today 3. If labs normal - refill current Levo daily 4. Re-check TSH, Free T4 in 4 months with annual then can space to q 6 months      Relevant Orders   TSH   T4, free   Hyperlipidemia    Controlled cholesterol on lifestyle Last lipid panel 01/2016  Plan: 1.  Encourage improved lifestyle - low carb/cholesterol, reduce portion size, continue improving  regular exercise 2. Follow-up 4 months lipids fasting, review ASCVD consider ASA / statin      Hypertension    Elevated initial BP, repeat manual check improved. - Home BP readings limited but appropriate to borderline elevated  Complication with scleroderma, diastolic CHF/pulm HTN  Plan:  1. Continue current BP regimen - Amlodipine 5mg  daily 2. Encourage improved lifestyle - low sodium diet, regular exercise 3. Start monitor BP outside office, bring readings to next visit, if persistently >140/90 or new symptoms notify office sooner 4. Follow-up 4 months annual      Pulmonary arterial hypertension (HCC)    Stable, chronic problem, secondary to scleroderma pulmonary fibrosis Followed by Rheumatology with yearly ECHO, last 09/2016      Rheumatoid arthritis (HCC)    Stable chronic problem, followed by Rheumatology Complicated with scleroderma On plaquenil      Scleroderma (HCC)    Stable chronic problem, followed by Rheumatology Complicated with RA, pulmonary fibrosis, pulmonary HTN On plaquenil Lab monitoring, ECHO, and PFTs per Rheumatology      Seasonal allergies    Stable without flare Follow-up PRN, may use OTC anti-histamine and therapy as needed       Other Visit Diagnoses    Encounter to establish care with new doctor       Needs flu shot       Relevant Orders   Flu vaccine HIGH DOSE PF      No orders of the defined types were placed in this encounter.  Follow up plan: Return in about 4 months (around 02/19/2017) for Medicare Physical CPE.  04/19/2017, DO [facility] 10/19/2016, 12:43 PM

## 2016-10-19 NOTE — Assessment & Plan Note (Addendum)
Stable chronic problem, followed by Rheumatology Complicated with scleroderma On plaquenil

## 2016-10-19 NOTE — Assessment & Plan Note (Signed)
Controlled cholesterol on lifestyle Last lipid panel 01/2016  Plan: 1. Encourage improved lifestyle - low carb/cholesterol, reduce portion size, continue improving  regular exercise 2. Follow-up 4 months lipids fasting, review ASCVD consider ASA / statin

## 2016-10-19 NOTE — Patient Instructions (Signed)
Thank you for coming to the clinic today.  1. Continue current dose Levothyroxine daily - you should have 90 pills in bottle from Dr Margaretmary Eddy rx - We will check Thyroid lab today, TSH and Free T4 - Will release results to MyChart with my comments - Most likely stay on same dose if doing well. Will send new refill to pharmacy at that time  2. For BP improved on re-check - Likely will refill or renew Amlodipine as well  3. Flu Shot today, may take Tylenol as needed  4. We will update pneumonia vaccine record, you would be due within 1 year for last booster Pneumovax-23, can get from your Rheumatologist if you prefer  Request records from Rheumatology  LABS TODAY  DUE for FASTING BLOOD WORK (no food or drink after midnight before the lab appointment, only water or coffee without cream/sugar on the morning of)  SCHEDULE "Lab Only" visit in the morning at the clinic for lab draw in  4 MONTHS  - Make sure Lab Only appointment is at about 1 week before your next appointment, so that results will be available  For Lab Results, once available within 2-3 days of blood draw, you can can log in to MyChart online to view your results and a brief explanation. Also, we can discuss results at next follow-up visit.   Please schedule a Follow-up Appointment to: Return in about 4 months (around 02/19/2017) for Medicare Physical CPE.  If you have any other questions or concerns, please feel free to call the clinic or send a message through MyChart. You may also schedule an earlier appointment if necessary.  Additionally, you may be receiving a survey about your experience at our clinic within a few days to 1 week by e-mail or mail. We value your feedback.  Saralyn Pilar, DO [facility], [place]

## 2016-10-19 NOTE — Assessment & Plan Note (Signed)
Elevated initial BP, repeat manual check improved. - Home BP readings limited but appropriate to borderline elevated  Complication with scleroderma, diastolic CHF/pulm HTN  Plan:  1. Continue current BP regimen - Amlodipine 5mg  daily 2. Encourage improved lifestyle - low sodium diet, regular exercise 3. Start monitor BP outside office, bring readings to next visit, if persistently >140/90 or new symptoms notify office sooner 4. Follow-up 4 months annual

## 2016-10-19 NOTE — Assessment & Plan Note (Signed)
Stable without flare Follow-up PRN, may use OTC anti-histamine and therapy as needed

## 2016-10-20 ENCOUNTER — Other Ambulatory Visit: Payer: Self-pay | Admitting: Family Medicine

## 2016-10-20 DIAGNOSIS — I10 Essential (primary) hypertension: Secondary | ICD-10-CM

## 2016-10-20 DIAGNOSIS — E039 Hypothyroidism, unspecified: Secondary | ICD-10-CM

## 2016-10-20 MED ORDER — LEVOTHYROXINE SODIUM 75 MCG PO TABS: 75.0000 ug | ORAL_TABLET | ORAL | 3 refills | Status: DC

## 2016-10-20 MED ORDER — AMLODIPINE BESYLATE 5 MG PO TABS: 5.0000 mg | ORAL_TABLET | Freq: Every day | ORAL | 3 refills | Status: DC

## 2016-12-01 ENCOUNTER — Other Ambulatory Visit: Payer: Self-pay

## 2016-12-01 ENCOUNTER — Ambulatory Visit: Payer: Medicare Other | Admitting: Nurse Practitioner

## 2016-12-01 ENCOUNTER — Encounter: Payer: Self-pay | Admitting: Nurse Practitioner

## 2016-12-01 VITALS — BP 126/63 | HR 81 | Temp 98.5°F | Ht 64.0 in | Wt 108.0 lb

## 2016-12-01 DIAGNOSIS — J0100 Acute maxillary sinusitis, unspecified: Secondary | ICD-10-CM

## 2016-12-01 MED ORDER — AMOXICILLIN 500 MG PO TABS: 500.0000 mg | ORAL_TABLET | Freq: Two times a day (BID) | ORAL | 0 refills | Status: AC

## 2016-12-01 NOTE — Progress Notes (Signed)
Subjective:    Patient ID: Susan Small, female    DOB: Aug 25, 1950, 66 y.o.   MRN: [id number]  Susan Small is a 66 y.o. female presenting on 12/01/2016 for Sinus Problem (facial pressure on the left side, nasal drainage mostly on the left. x 1.5 weeks )   HPI Sinus Congestion Symptoms started about 1.5 weeks ago with URI symptoms of cough, nasal and sinus congestion, rhinorrhea.  She notes initially improved, but then worse w/o cough when it worsened.  Now is having left side face pressure w/ eye aching.  Notes "left side congestion very yellow."   - Pt denies fever, chills, sweats, nausea, vomiting, diarrhea and constipation.  Also denies sore throat, ear pressure/pain/fullness, tooth/jaw pain.  Pt has Scleroderma and is currently on hydroxychloroquine.  Notes she has weaker immune system and has not been able to clear this infection.  Currently not having any shortness of breath.  Social History   Tobacco Use  . Smoking status: Never Smoker  . Smokeless tobacco: Never Used  Substance Use Topics  . Alcohol use: No    Alcohol/week: 0.0 oz  . Drug use: No    Review of Systems Per HPI unless specifically indicated above     Objective:    BP 126/63 (BP Location: Right Arm, Patient Position: Sitting, Cuff Size: Normal)   Pulse 81   Temp 98.5 F (36.9 C) (Oral)   Ht 5\' 4"  (1.626 m)   Wt 108 lb (49 kg)   BMI 18.54 kg/m   Wt Readings from Last 3 Encounters:  12/01/16 108 lb (49 kg)  10/19/16 106 lb 12.8 oz (48.4 kg)  08/05/16 106 lb (48.1 kg)    Physical Exam  General - underweight, well-appearing, NAD HEENT - Normocephalic, atraumatic, PERRL, EOMI, patent nares w/ congestion, left maxillary sinus tenderness without right maxillary/left or right frontal tenderness, oropharynx clear, MMM, TM normal, Ear canal  normal, external ear normal w/o lesions Neck - supple, non-tender, mild cervical LAD Heart - RRR, no murmurs heard Lungs - Clear throughout all lobes with  diminished air movement, no wheezing, crackles, or rhonchi. Normal work of breathing. Extremeties - non-tender, no edema, cap refill < 2 seconds, peripheral pulses intact +2 bilaterally Skin - warm, dry Neuro - awake, alert, oriented x3, normal gait Psych - Normal mood and affect, normal behavior    Results for orders placed or performed in visit on 10/19/16  TSH  Result Value Ref Range   TSH 1.53 0.40 - 4.50 mIU/L  T4, free  Result Value Ref Range   Free T4 1.3 0.8 - 1.8 ng/dL      Assessment & Plan:   Problem List Items Addressed This Visit    None    Visit Diagnoses    Acute non-recurrent maxillary sinusitis    -  Primary Consistent with URI and secondary sinusitis with symptoms worsening over the past 7 days and initial symptoms of nasal congestion and sinus pressure over 10 days ago.   Plan: 1.START taking amoxicillin 500 mg tablets every 12 hours for 10 days.  Discussed completing antibiotic. - While on antibiotic, take a probiotic OTC or from food. - Pt declined any nasal sprays. - Start Mucinex-DM OTC for  7-10 days prn congestion 2. Supportive care with nasal saline, warm herbal tea with honey, 3. Improve hydration 4. Tylenol / Motrin PRN fevers  5. Return criteria given   Relevant Medications   amoxicillin (AMOXIL) 500 MG tablet  Meds ordered this encounter  Medications  . amoxicillin (AMOXIL) 500 MG tablet    Sig: Take 1 tablet (500 mg total) by mouth 2 (two) times daily for 10 days.    Dispense:  20 tablet    Refill:  0    Order Specific Question:   Supervising Provider    Answer:   Smitty Cords [[id number]]     Follow up plan: Return 5-7 days if symptoms worsen or fail to improve.   Wilhelmina Mcardle, DNP, AGPCNP-BC Adult Gerontology Primary Care Nurse Practitioner [facility] 12/01/2016, 5:29 PM

## 2016-12-01 NOTE — Patient Instructions (Addendum)
Susan Small, Thank you for coming in to clinic today.  1. You have a left maxillary sinusitis. - I recommend good hand washing. - START taking amoxicillin 500 mg twice daily for 10 days.  Make sure to take all doses of your antibiotic. - While you are on an antibiotic, take a probiotic.  Antibiotics kill good and bad bacteria.  A probiotic helps to replace your good bacteria. Probiotic pills can be found over the counter.  One brand is Florastor, but you can use any brand you prefer.  You can also get good bacteria from foods.  These foods are yogurt, kefir, kombucha, and fresh, refrigerated and uncooked sauerkraut. - Drink plenty of fluids.   Other over the counter medications you may try, if needed for symptoms are: - If congestion is worse, start OTC Mucinex (or may try Mucinex-DM for cough) up to 7-10 days then stop - You may try over the counter Nasal Saline spray (Simply Saline, Ocean Spray) as needed to reduce congestion. - Start taking Tylenol extra strength 1 to 2 tablets every 6-8 hours for aches or fever/chills for next few days as needed.  Do not take more than 3,000 mg in 24 hours from all medicines.   - Drink warm herbal tea with honey for sore throat.   If symptoms are significantly worse with persistent fevers/chills despite tylenol/ibpurofen, nausea, vomiting unable to tolerate food/fluids or medicine, body aches, or shortness of breath, sinus pain pressure or worsening productive cough, then follow-up for re-evaluation, may seek more immediate care at Urgent Care or the ED if you are more concerned that it is an emergency.  Please schedule a follow-up appointment with Wilhelmina Mcardle, AGNP. Return 5-7 days if symptoms worsen or fail to improve.   If you have any other questions or concerns, please feel free to call the clinic or send a message through MyChart. You may also schedule an earlier appointment if necessary.  You will receive a survey after today's visit either digitally by  e-mail or paper by [employer]. Your experiences and feedback matter to [place].  Please respond so we know how we are doing as we provide care for you.   Wilhelmina Mcardle, DNP, AGNP-BC Adult Gerontology Nurse Practitioner [facility], [facility]

## 2017-01-22 LAB — HM MAMMOGRAPHY

## 2017-02-05 ENCOUNTER — Ambulatory Visit: Payer: Medicare Other | Admitting: Family Medicine

## 2017-02-22 ENCOUNTER — Other Ambulatory Visit: Payer: Self-pay

## 2017-02-22 DIAGNOSIS — E039 Hypothyroidism, unspecified: Secondary | ICD-10-CM

## 2017-02-22 DIAGNOSIS — E782 Mixed hyperlipidemia: Secondary | ICD-10-CM

## 2017-02-22 DIAGNOSIS — Z79899 Other long term (current) drug therapy: Secondary | ICD-10-CM

## 2017-02-22 DIAGNOSIS — I10 Essential (primary) hypertension: Secondary | ICD-10-CM

## 2017-02-23 ENCOUNTER — Other Ambulatory Visit: Payer: Medicare Other

## 2017-02-24 LAB — HEMOGLOBIN A1C
EAG (MMOL/L): 5.7 (calc)
Hgb A1c MFr Bld: 5.2 % of total Hgb (ref <5.7)
Mean Plasma Glucose: 103 (calc)

## 2017-02-24 LAB — BASIC METABOLIC PANEL WITH GFR
BUN: 12 mg/dL (ref 7–25)
CO2: 26 mmol/L (ref 20–32)
CREATININE: 0.86 mg/dL (ref 0.50–0.99)
Calcium: 8.8 mg/dL (ref 8.6–10.4)
Chloride: 105 mmol/L (ref 98–110)
GFR, Est African American: 82 mL/min/{1.73_m2} (ref >=60)
GFR, Est Non African American: 71 mL/min/{1.73_m2} (ref >=60)
Glucose, Bld: 87 mg/dL (ref 65–99)
POTASSIUM: 3.7 mmol/L (ref 3.5–5.3)
SODIUM: 137 mmol/L (ref 135–146)

## 2017-02-24 LAB — LIPID PANEL
CHOL/HDL RATIO: 3.7 (calc) (ref <5.0)
Cholesterol: 146 mg/dL (ref <200)
HDL: 40 mg/dL — ABNORMAL LOW (ref >50)
LDL CHOLESTEROL (CALC): 90 mg/dL
NON-HDL CHOLESTEROL (CALC): 106 mg/dL (ref <130)
TRIGLYCERIDES: 72 mg/dL (ref <150)

## 2017-02-24 LAB — T4, FREE: Free T4: 1.3 ng/dL (ref 0.8–1.8)

## 2017-02-24 LAB — TSH: TSH: 2.13 mIU/L (ref 0.40–4.50)

## 2017-02-25 ENCOUNTER — Ambulatory Visit (INDEPENDENT_AMBULATORY_CARE_PROVIDER_SITE_OTHER): Payer: Medicare Other | Admitting: Family Medicine

## 2017-02-25 ENCOUNTER — Encounter: Payer: Self-pay | Admitting: Family Medicine

## 2017-02-25 ENCOUNTER — Other Ambulatory Visit: Payer: Self-pay | Admitting: Family Medicine

## 2017-02-25 VITALS — BP 130/80 | HR 64 | Temp 97.7°F | Resp 16 | Ht 64.0 in | Wt 108.0 lb

## 2017-02-25 DIAGNOSIS — M349 Systemic sclerosis, unspecified: Secondary | ICD-10-CM | POA: Diagnosis not present

## 2017-02-25 DIAGNOSIS — M069 Rheumatoid arthritis, unspecified: Secondary | ICD-10-CM | POA: Diagnosis not present

## 2017-02-25 DIAGNOSIS — E039 Hypothyroidism, unspecified: Secondary | ICD-10-CM | POA: Diagnosis not present

## 2017-02-25 DIAGNOSIS — I10 Essential (primary) hypertension: Secondary | ICD-10-CM | POA: Diagnosis not present

## 2017-02-25 DIAGNOSIS — Z0000 Encounter for general adult medical examination without abnormal findings: Secondary | ICD-10-CM

## 2017-02-25 NOTE — Assessment & Plan Note (Signed)
Stable chronic problem, followed by Rheumatology Complicated with scleroderma On plaquenil 

## 2017-02-25 NOTE — Patient Instructions (Addendum)
Thank you for coming to the office today.  1.  Keep up the good work!  Will stay tuned for lab results and info from your Rheumatologist  DUE for FASTING BLOOD WORK (no food or drink after midnight before the lab appointment, only water or coffee without cream/sugar on the morning of)  SCHEDULE "Lab Only" visit in the morning at the clinic for lab draw in 6 MONTHS   - Make sure Lab Only appointment is at about 1 week before your next appointment, so that results will be available  For Lab Results, once available within 2-3 days of blood draw, you can can log in to MyChart online to view your results and a brief explanation. Also, we can discuss results at next follow-up visit.   Please schedule a Follow-up Appointment to: Return in about 6 months (around 08/25/2017) for TSH.    If you have any other questions or concerns, please feel free to call the office or send a message through MyChart. You may also schedule an earlier appointment if necessary.  Additionally, you may be receiving a survey about your experience at our office within a few days to 1 week by e-mail or mail. We value your feedback.  Saralyn Pilar, DO [facility], [place]

## 2017-02-25 NOTE — Assessment & Plan Note (Signed)
Stable chronic problem, followed by Rheumatology Complicated with RA, pulmonary fibrosis, pulmonary HTN On plaquenil Lab monitoring, ECHO, and PFTs per Rheumatology 

## 2017-02-25 NOTE — Assessment & Plan Note (Signed)
Controlled HTN - Home BP readings limited but appropriate to borderline elevated  Complication with scleroderma, diastolic CHF/pulm HTN  Plan:  1. Continue current BP regimen - Amlodipine 5mg  daily 2. Encourage improved lifestyle - low sodium diet, regular exercise 3. Continue monitor BP outside office, bring readings to next visit, if persistently >140/90 or new symptoms notify office sooner 4. Follow-up 6 months

## 2017-02-25 NOTE — Assessment & Plan Note (Signed)
Controlled hypothyroidism on current dose - Recent TSH trend normal to 0.38 > 0.481 > 18 > normal range - Chronic history had been stable >20 years  Plan: 1. Continue current Levothyroxine daily  Has refills 2. Follow-up q 6 months - TSH, Free T4

## 2017-02-25 NOTE — Progress Notes (Signed)
Subjective:    Patient ID: Susan Small, female    DOB: 1950/12/18, 67 y.o.   MRN: [id number]  Susan Small is a 67 y.o. female presenting on 02/25/2017 for Annual Exam   HPI   Here for Annual Physical and Lab Review  HYPOTHYROIDISM - Last visit with me 10/19/16, for initial visit for same problem, treated with continued Levo daily and re-check TSH, see prior notes for background information. 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 She has been taking Plaquenil and stable on this dose currently. - States she has had chronic problem with fibrosis involving her lungs affecting pulmonary HTN, followed with ECHOcardiogram yearly by Rheum, last done 09/2016 mostly unremarkable  CHRONIC HTN Reports no new concerns. Current Meds - Amlodipine 5mg    Reports good compliance, took meds today. Tolerating well, w/o complaints.  Low HDL: - Reports no concerns. Last lipid panel 02/2017, mostly controlled except low HDL compared to prior - Not on Statin, never on cholesterol med  BMI >18 / Low Weight Reports chronic issue over past few years with lower weight, difficulty with unable to gain much weight. She is not trying to lose weight has had a few lb loss recently past few months spontaneously not trying to, but now has gained wt back Lifestyle - Diet: balanced diet, good portion size to avoid wt loss - Exercise: regular exercise walking x 3 days week, and some gym classes   Health Maintenance: UTD Flu  - Reportedly UTD on Pneumonia vaccines - entered vaccine record by her  report, she declines any repeat pneumonia doses  Declined Hep C and HIV screen  UTD Mammogram  Depression screen [facility] 2/9 02/25/2017 08/05/2016 07/11/2015  Decreased Interest 0 0 0  Down, Depressed, Hopeless 0 0 0  PHQ - 2 Score 0 0 0    Past Medical History:  Diagnosis Date  . Hypertension    Past Surgical History:  Procedure Laterality Date  . CARPAL TUNNEL RELEASE Right   . FLAT FOOT RECONSTRUCTION-TAL GASTROC RECESSION  08/2010  . TOTAL HIP ARTHROPLASTY Right 10/2008   Social History   Socioeconomic History  . Marital status: Married    Spouse name: Ruthy Forry  . Number of children: Not on file  . Years of education: Not on file  . Highest education level: Not on file  Social Needs  . Financial resource strain: Not on file  . Food insecurity - worry: Not on file  . Food insecurity - inability: Not on file  . Transportation needs - medical: Not on file  . Transportation needs - non-medical: Not on file  Occupational History  . 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 Smoking status: Never Smoker  . Smokeless tobacco: Never Used  Substance and Sexual Activity  . Alcohol use: No    Alcohol/week: 0.0 oz  . Drug use: No  . Sexual activity: No  Other Topics Concern  . Not on file  Social History Narrative  . Not on file   Family History  Problem Relation Age  of Onset  . Hypertension Mother    Current Outpatient Medications on File Prior to Visit  Medication Sig  . amLODipine (NORVASC) 5 MG tablet Take 1 tablet (5 mg total) by mouth daily.  . hydroxychloroquine (PLAQUENIL) 200 MG tablet Take 400 mg by mouth daily.  Marland Kitchen levothyroxine (SYNTHROID, LEVOTHROID) 75 MCG tablet Take 1 tablet (75 mcg total) by mouth every morning.   No current facility-administered medications on file prior to visit.     Review of Systems  Constitutional: Negative for activity change, appetite change,  chills, diaphoresis, fatigue, fever and unexpected weight change (>6 months ago wt down 2-3 lbs unknown cause then improved).  HENT: Negative for congestion, hearing loss and sinus pressure.   Eyes: Negative for visual disturbance.  Respiratory: Negative for apnea, cough, chest tightness, shortness of breath and wheezing.   Cardiovascular: Negative for chest pain, palpitations and leg swelling.  Gastrointestinal: Negative for abdominal pain, anal bleeding, blood in stool, constipation, diarrhea, nausea and vomiting.  Endocrine: Negative for cold intolerance.  Genitourinary: Negative for dysuria, frequency, hematuria and urgency.  Musculoskeletal: Negative for arthralgias, back pain and neck pain.  Skin: Negative for rash.       Chronic fibrotic changes of fingers  Allergic/Immunologic: Negative for environmental allergies.  Neurological: Negative for dizziness, weakness, light-headedness, numbness and headaches.  Hematological: Negative for adenopathy.  Psychiatric/Behavioral: Negative for behavioral problems, dysphoric mood and sleep disturbance. The patient is not nervous/anxious.    Per HPI unless specifically indicated above       Objective:    BP 130/80 Comment: manual  Pulse 64   Temp 97.7 F (36.5 C) (Oral)   Resp 16   Ht 5\' 4"  (1.626 m)   Wt 108 lb (49 kg)   BMI 18.54 kg/m   Wt Readings from Last 3 Encounters:  02/25/17 108 lb (49 kg)  12/01/16 108 lb (49 kg)  10/19/16 106 lb 12.8 oz (48.4 kg)    Physical Exam  Constitutional: She is oriented to person, place, and time. She appears well-developed and well-nourished. No distress.  Well-appearing, comfortable, cooperative  HENT:  Head: Normocephalic and atraumatic.  Mouth/Throat: Oropharynx is clear and moist.  Eyes: Conjunctivae and EOM are normal. Pupils are equal, round, and reactive to light. Right eye exhibits no discharge. Left eye exhibits no discharge.  Neck: Normal range of motion. Neck supple. No  thyromegaly present.  Cardiovascular: Normal rate, regular rhythm, normal heart sounds and intact distal pulses.  No murmur heard. Pulmonary/Chest: Effort normal and breath sounds normal. No respiratory distress. She has no wheezes. She has no rales.  Abdominal: Soft. Bowel sounds are normal. She exhibits no distension and no mass. There is no tenderness.  Musculoskeletal: Normal range of motion. She exhibits no edema or tenderness.  Upper / Lower Extremities: - Normal muscle tone, strength bilateral upper extremities 5/5, lower extremities 5/5  Bilateral hands/fingers with some chronic flexion deformity and ulnar deviation, skin of fingers appears consistent with scleroderma with tight appearance and some dryness  Lymphadenopathy:    She has no cervical adenopathy.  Neurological: She is alert and oriented to person, place, and time.  Distal sensation intact to light touch all extremities  Skin: Skin is warm and dry. Rash (Stable chronic capillary pin point rash hands and face, w/o worsening or change) noted. She is not diaphoretic. No erythema.  Psychiatric: She has a normal mood and affect. Her behavior is normal.  Well groomed, good eye contact, normal speech and thoughts  Nursing  note and vitals reviewed.  Results for orders placed or performed in visit on 02/22/17  TSH  Result Value Ref Range   TSH 2.13 0.40 - 4.50 mIU/L  T4, free  Result Value Ref Range   Free T4 1.3 0.8 - 1.8 ng/dL  Lipid panel  Result Value Ref Range   Cholesterol 146 <200 mg/dL   HDL 40 (L) >96 mg/dL   Triglycerides 72 <283 mg/dL   LDL Cholesterol (Calc) 90 mg/dL (calc)   Total CHOL/HDL Ratio 3.7 <5.0 (calc)   Non-HDL Cholesterol (Calc) 106 <130 mg/dL (calc)  Hemoglobin M6Q  Result Value Ref Range   Hgb A1c MFr Bld 5.2 <5.7 % of total Hgb   Mean Plasma Glucose 103 (calc)   eAG (mmol/L) 5.7 (calc)  BASIC METABOLIC PANEL WITH GFR  Result Value Ref Range   Glucose, Bld 87 65 - 99 mg/dL   BUN 12 7 - 25  mg/dL   Creat 9.47 6.54 - 6.50 mg/dL   GFR, Est Non African American 71 > OR = 60 mL/min/1.5m2   GFR, Est African American 82 > OR = 60 mL/min/1.64m2   BUN/Creatinine Ratio NOT APPLICABLE 6 - 22 (calc)   Sodium 137 135 - 146 mmol/L   Potassium 3.7 3.5 - 5.3 mmol/L   Chloride 105 98 - 110 mmol/L   CO2 26 20 - 32 mmol/L   Calcium 8.8 8.6 - 10.4 mg/dL      Assessment & Plan:   Problem List Items Addressed This Visit    Adult hypothyroidism    Controlled hypothyroidism on current dose - Recent TSH trend normal to 0.38 > 0.481 > 18 > normal range - Chronic history had been stable >20 years  Plan: 1. Continue current Levothyroxine daily  Has refills 2. Follow-up q 6 months - TSH, Free T4      Hypertension    Controlled HTN - Home BP readings limited but appropriate to borderline elevated  Complication with scleroderma, diastolic CHF/pulm HTN  Plan:  1. Continue current BP regimen - Amlodipine 5mg  daily 2. Encourage improved lifestyle - low sodium diet, regular exercise 3. Continue monitor BP outside office, bring readings to next visit, if persistently >140/90 or new symptoms notify office sooner 4. Follow-up 6 months      Rheumatoid arthritis (HCC)    Stable chronic problem, followed by Rheumatology Complicated with scleroderma On plaquenil      Scleroderma (HCC)    Stable chronic problem, followed by Rheumatology Complicated with RA, pulmonary fibrosis, pulmonary HTN On plaquenil Lab monitoring, ECHO, and PFTs per Rheumatology       Other Visit Diagnoses    Annual physical exam    -  Primary UTD on health maintenance Encourage improve healthy diet / lifestyle, goal to avoid wt loss, maintain BMI >18 Follow-up       No orders of the defined types were placed in this encounter.   Follow up plan: Return in about 6 months (around 08/25/2017) for TSH.  Future lab ordered for 08/2017  09/2017, DO [facility] 02/25/2017, 4:41 PM

## 2017-03-11 ENCOUNTER — Other Ambulatory Visit: Payer: Self-pay | Admitting: Family Medicine

## 2017-03-11 DIAGNOSIS — E039 Hypothyroidism, unspecified: Secondary | ICD-10-CM

## 2017-05-21 ENCOUNTER — Ambulatory Visit (INDEPENDENT_AMBULATORY_CARE_PROVIDER_SITE_OTHER): Payer: Medicare Other | Admitting: Family Medicine

## 2017-05-21 ENCOUNTER — Encounter: Payer: Self-pay | Admitting: Family Medicine

## 2017-05-21 VITALS — BP 133/73 | HR 64 | Temp 98.2°F | Resp 16 | Ht 64.0 in | Wt 104.0 lb

## 2017-05-21 DIAGNOSIS — J0100 Acute maxillary sinusitis, unspecified: Secondary | ICD-10-CM | POA: Diagnosis not present

## 2017-05-21 MED ORDER — AMOXICILLIN 500 MG PO CAPS: 500.0000 mg | ORAL_CAPSULE | Freq: Two times a day (BID) | ORAL | 0 refills | Status: DC

## 2017-05-21 NOTE — Progress Notes (Signed)
Subjective:    Patient ID: Susan Small, female    DOB: August 20, 1950, 67 y.o.   MRN: [id number]  Susan Small is a 67 y.o. female presenting on 05/21/2017 for Cough (runny nose, HA, eye pressure onset 5 days)  Patient presents for a same day appointment.  HPI   SINUSITIS Reports symptoms started about 3-5 days ago with sinus congestion and pressure, and some eye aching, blowing thicker congestion out of nose, worse in morning has productive cough in morning with thicker sputum, later has some dry cough later in day. - Not tried any OTC medicines currently. In past she has been improved with Amoxicillin or Azithromycin. - She does not use nose spray - Denies any fevers, chills, sweats, nausea vomiting, body aches  History of Scleroderma  History of oral lichen planus - Reports has problem over past few months >6 with area of irritation and pain inside L cheek, and ultimately saw her Dentist, dx Licen Planus given rx clobetasol topical steroid with improvement but then it came back some less severe after finished  Depression screen [facility] 2/9 02/25/2017 08/05/2016 07/11/2015  Decreased Interest 0 0 0  Down, Depressed, Hopeless 0 0 0  PHQ - 2 Score 0 0 0    Social History   Tobacco Use  . Smoking status: Never Smoker  . Smokeless tobacco: Never Used  Substance Use Topics  . Alcohol use: No    Alcohol/week: 0.0 oz  . Drug use: No    Review of Systems Per HPI unless specifically indicated above     Objective:    BP 133/73   Pulse 64   Temp 98.2 F (36.8 C) (Oral)   Resp 16   Ht 5\' 4"  (1.626 m)   Wt 104 lb (47.2 kg)   SpO2 100%   BMI 17.85 kg/m   Wt Readings from Last 3 Encounters:  05/21/17 104 lb (47.2 kg)  02/25/17 108 lb (49 kg)  12/01/16 108 lb (49 kg)    Physical Exam  Constitutional: She is oriented to person, place, and time. She appears well-developed and well-nourished. No distress.  Mostly well appearing slightly tired appearing, comfortable, cooperative    HENT:  Head: Normocephalic and atraumatic.  Mild maxillary sinuses tender. Nares with significant turbinate edema without purulence. R TMs clear without erythema, effusion or bulging, L TM obscured by dry cerumen. Oropharynx clear without erythema, exudates, edema or asymmetry.  Left buccal mucosa with area of white appearing plaque and changes, consistent with lichen planus  Eyes: Conjunctivae are normal. Right eye exhibits no discharge. Left eye exhibits no discharge.  Neck: Normal range of motion. Neck supple. No thyromegaly present.  Cardiovascular: Normal rate, regular rhythm, normal heart sounds and intact distal pulses.  No murmur heard. Pulmonary/Chest: Effort normal and breath sounds normal. No respiratory distress. She has no wheezes. She has no rales.  Musculoskeletal: Normal range of motion. She exhibits no edema.  Lymphadenopathy:    She has no cervical adenopathy.  Neurological: She is alert and oriented to person, place, and time.  Skin: Skin is warm and dry. No rash noted. She is not diaphoretic. No erythema.  Psychiatric: Her behavior is normal.  Nursing note and vitals reviewed.    Results for orders placed or performed in visit on 02/22/17  TSH  Result Value Ref Range   TSH 2.13 0.40 - 4.50 mIU/L  T4, free  Result Value Ref Range   Free T4 1.3 0.8 - 1.8 ng/dL  Lipid  panel  Result Value Ref Range   Cholesterol 146 <200 mg/dL   HDL 40 (L) >17 mg/dL   Triglycerides 72 <494 mg/dL   LDL Cholesterol (Calc) 90 mg/dL (calc)   Total CHOL/HDL Ratio 3.7 <5.0 (calc)   Non-HDL Cholesterol (Calc) 106 <130 mg/dL (calc)  Hemoglobin W9Q  Result Value Ref Range   Hgb A1c MFr Bld 5.2 <5.7 % of total Hgb   Mean Plasma Glucose 103 (calc)   eAG (mmol/L) 5.7 (calc)  BASIC METABOLIC PANEL WITH GFR  Result Value Ref Range   Glucose, Bld 87 65 - 99 mg/dL   BUN 12 7 - 25 mg/dL   Creat 7.59 1.63 - 8.46 mg/dL   GFR, Est Non African American 71 > OR = 60 mL/min/1.98m2   GFR, Est  African American 82 > OR = 60 mL/min/1.12m2   BUN/Creatinine Ratio NOT APPLICABLE 6 - 22 (calc)   Sodium 137 135 - 146 mmol/L   Potassium 3.7 3.5 - 5.3 mmol/L   Chloride 105 98 - 110 mmol/L   CO2 26 20 - 32 mmol/L   Calcium 8.8 8.6 - 10.4 mg/dL      Assessment & Plan:   Problem List Items Addressed This Visit    None    Visit Diagnoses    Acute non-recurrent maxillary sinusitis    -  Primary   Relevant Medications   amoxicillin (AMOXIL) 500 MG capsule      Consistent with acute rhinosinusitis, likely initially viral URI vs allergic rhinitis component with possible worsening now, potential for future risk of progression to bacterial infection, especially in setting of reduced immune system with history of scleroderma on chronic therapy plaquenil.  Plan: 1. Reassurance, likely self-limited - no indication for antibiotics at this time - If not improve or worsening within 48-72 hours, agree to print rx Amoxicillin 500mg  BID x 10 days, given to her today printed - Recommended nasal spray such as Atrovent or Floanse, she has declined all nose sprays 2. Start loratadine (Claritin) 10mg  daily - May try mucinex or other OTC Return criteria reviewed   Meds ordered this encounter  Medications  . amoxicillin (AMOXIL) 500 MG capsule    Sig: Take 1 capsule (500 mg total) by mouth 2 (two) times daily. For 10 days    Dispense:  20 capsule    Refill:  0    Follow up plan: Return in about 1 week (around 05/28/2017), or if symptoms worsen or fail to improve, for sinusitis.   , DO [facility] 05/21/2017, 1:23 PM

## 2017-05-21 NOTE — Patient Instructions (Addendum)
Thank you for coming to the office today.  1. It sounds like you have persistent Sinus Congestion or "Rhinosinusitis" - I do not think that this is a Bacterial Sinus Infection. Usually these are caused by Viruses or Allergies, and will run it's course in about 7 to 10 days. - No antibiotics are needed - quite yet, since under 7 days and no fever seems may still be viral  - Printed rx Amoxicillin - may fill if feel you need it, or wait 48 hours to see if improve - If not improving on antibiotic or worsening, notify office next week - Offered nasal spray if you are interested - Atrovent, call us back - May start Claritin anti histamine 10mg  daily (or similar med generic)  - Improve hydration by drinking plenty of clear fluids (water, gatorade) to reduce secretions and thin congestion - Congestion draining down throat can cause irritation. May try warm herbal tea with honey, cough drops - Can take Tylenol or Ibuprofen as needed for fevers  If you develop persistent fever >101F for at least 3 consecutive days, headaches with sinus pain or pressure or persistent earache, please schedule a follow-up evaluation within next few days to week.  Please schedule a Follow-up Appointment to: Return in about 1 week (around 05/28/2017), or if symptoms worsen or fail to improve, for sinusitis.  If you have any other questions or concerns, please feel free to call the office or send a message through MyChart. You may also schedule an earlier appointment if necessary.  Additionally, you may be receiving a survey about your experience at our office within a few days to 1 week by e-mail or mail. We value your feedback.  05/30/2017, DO [facility], [facility]

## 2017-07-12 ENCOUNTER — Other Ambulatory Visit: Payer: Self-pay | Admitting: Internal Medicine

## 2017-07-12 DIAGNOSIS — M818 Other osteoporosis without current pathological fracture: Secondary | ICD-10-CM

## 2017-08-19 ENCOUNTER — Other Ambulatory Visit: Payer: Medicare Other

## 2017-08-19 DIAGNOSIS — E039 Hypothyroidism, unspecified: Secondary | ICD-10-CM

## 2017-08-19 LAB — T4, FREE: FREE T4: 1 ng/dL (ref 0.8–1.8)

## 2017-08-19 LAB — TSH: TSH: 8.61 m[IU]/L — AB (ref 0.40–4.50)

## 2017-08-26 ENCOUNTER — Ambulatory Visit: Payer: Medicare Other | Admitting: Family Medicine

## 2017-08-26 ENCOUNTER — Encounter: Payer: Self-pay | Admitting: Family Medicine

## 2017-08-26 VITALS — BP 144/74 | HR 56 | Temp 98.0°F | Resp 16 | Ht 64.0 in | Wt 105.8 lb

## 2017-08-26 DIAGNOSIS — F4321 Adjustment disorder with depressed mood: Secondary | ICD-10-CM

## 2017-08-26 DIAGNOSIS — E039 Hypothyroidism, unspecified: Secondary | ICD-10-CM

## 2017-08-26 DIAGNOSIS — E440 Moderate protein-calorie malnutrition: Secondary | ICD-10-CM | POA: Diagnosis not present

## 2017-08-26 DIAGNOSIS — M810 Age-related osteoporosis without current pathological fracture: Secondary | ICD-10-CM | POA: Diagnosis not present

## 2017-08-26 NOTE — Patient Instructions (Addendum)
Thank you for coming to the office today.  Mild elevated TSH 8.6 - we discussed the possibility of adjusting your thyroid dose - however we can continue for now with same dose Levothyroxine daily for now - and re-check in 3 months  May try Ensure and Boost most days or daily for now to help with protein supplement for muscle mass and strength.  Support recommendation of Rheumatology for osteoporosis treatment  Follow-up with Dentist as discussed   DUE for FASTING BLOOD WORK (no food or drink after midnight before the lab appointment, only water or coffee without cream/sugar on the morning of)  SCHEDULE "Lab Only" visit in the morning at the clinic for lab draw in 3 MONTHS   - Make sure Lab Only appointment is at about 1 week before your next appointment, so that results will be available  For Lab Results, once available within 2-3 days of blood draw, you can can log in to MyChart online to view your results and a brief explanation. Also, we can discuss results at next follow-up visit.  Please schedule a Follow-up Appointment to: Return in about 3 months (around 11/26/2017) for Hypothyroid lab results / energy / Rheum - osteoporosis.  If you have any other questions or concerns, please feel free to call the office or send a message through MyChart. You may also schedule an earlier appointment if necessary.  Additionally, you may be receiving a survey about your experience at our office within a few days to 1 week by e-mail or mail. We value your feedback.  Saralyn Pilar, DO [facility], [place]

## 2017-08-26 NOTE — Assessment & Plan Note (Addendum)
Mild elevated TSH recent >8 but normal Free T4 Chronic hypothyroidism with fluctuation before, recently stable Not followed by Endocrine  Plan 1. Discussion on options - given clinically not as convinced hypothyroid is problem now will defer dose adjust at this time - Continue current Levothyroxine daily - Will re-check TSH and Free T4 in 3 months - sooner check now to determine trend

## 2017-08-26 NOTE — Assessment & Plan Note (Signed)
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

## 2017-08-26 NOTE — Progress Notes (Signed)
Subjective:    Patient ID: Susan Small, female    DOB: October 14, 1950, 67 y.o.   MRN: [id number]  Susan Small is a 67 y.o. female presenting on 08/26/2017 for Hypothyroidism   HPI   HYPOTHYROIDISM - Last visit with me 02/2017 same problem, treated with continued Levo daily after stable TSH T4 labs, see prior notes for background information. - Interval update with now with elevated TSH again up to 8.6, prior results TSH 1 to 2, but normal Free T4 still - Today patient reports unsure if thyroid or other factors affecting her, she feels pretty good but has some reduced energy and see below about muscle mass and weakness. - She is taking Levothyroxine daily AM before breakfast, no concerns - She is not interested in dose change today - Not followed by Endocrinology, she used to see Dr Kerrie Pleasure in past for Endocrine - She has episodes of abnormal thyroid results in past usually fluctuates by her report Denies thyroid nodule swelling fever chills temperature change swelling  Follow-up Scleroderma / Rheumatology / Osteoporosis / Generalized Weakness / Protein-Cal Malnutrition - Patient has complex medical history, see prior chart for background review - Followed by Dr Imogene Burn Rheumatology [facility]. They have monitored her with DEXA due upcoming test, last was in 2017 had Osteoporosis T -3.5, has been offered therapy with Prolia and similar agents and she has declined by her report, but she will reconsider this time. She has concern with teeth being weak and loose, she will see [facility] in a week or very soon. - Admits eating regular meals, but thinks may need more protein in diet she has not started boost or ensure, she stays very active with exercise 3 x weekly, thinks may have reduced muscle mass - Not had Vitamin D and B testing recently  Adjustment disorder with depressed mood No prior significant history of depression. Now recent change she is less involved with her  grandchildren's lives since they have moved for new school. Ages 38 and 1, she was very involved with picking them up and dropping them off and involved at school and activities, now less responsibility and she is having a little difficulty adjusting to this. She still denies depression, and not interested in treatment or medication. She has a very good support system otherwise with family, church, friends, exercising regularly  Health Maintenance: Due Flu Shot will return when in stock.  Depression screen [facility] 2/9 08/26/2017 02/25/2017 08/05/2016  Decreased Interest 0 0 0  Down, Depressed, Hopeless 0 0 0  PHQ - 2 Score 0 0 0    Social History   Tobacco Use  . Smoking status: Never Smoker  . Smokeless tobacco: Never Used  Substance Use Topics  . Alcohol use: No    Alcohol/week: 0.0 standard drinks  . Drug use: No    Review of Systems Per HPI unless specifically indicated above     Objective:    BP (!) 144/74 (BP Location: Left Arm, Cuff Size: Small)   Pulse (!) 56   Temp 98 F (36.7 C) (Oral)   Resp 16   Ht 5\' 4"  (1.626 m)   Wt 105 lb 12.8 oz (48 kg)   BMI 18.16 kg/m   Wt Readings from Last 3 Encounters:  08/26/17 105 lb 12.8 oz (48 kg)  05/21/17 104 lb (47.2 kg)  02/25/17 108 lb (49 kg)    Physical Exam  Constitutional: She is oriented to person, place, and time. She appears  well-developed and well-nourished. No distress.  Chronically ill and thin appearing, currently comfortable, cooperative  HENT:  Head: Normocephalic and atraumatic.  Mouth/Throat: Oropharynx is clear and moist.  Eyes: Conjunctivae are normal. Right eye exhibits no discharge. Left eye exhibits no discharge.  Neck: Normal range of motion. Neck supple. No thyromegaly present.  Cardiovascular: Regular rhythm, normal heart sounds and intact distal pulses.  No murmur heard. Mild braydcardia  Pulmonary/Chest: Effort normal and breath sounds normal. No respiratory distress. She has no wheezes. She has no  rales.  Musculoskeletal: Normal range of motion. She exhibits no edema.  Some generalized muscle atrophy facial and extremities  Lymphadenopathy:    She has no cervical adenopathy.  Neurological: She is alert and oriented to person, place, and time.  Skin: Skin is warm and dry. No rash noted. She is not diaphoretic. No erythema.  Psychiatric: She has a normal mood and affect. Her behavior is normal.  Well groomed, good eye contact, normal speech and thoughts. Good insight into health and mental health. Not anxious appearing. Slightly sad when discussing topics  Nursing note and vitals reviewed.    Results for orders placed or performed in visit on 08/19/17  T4, free  Result Value Ref Range   Free T4 1.0 0.8 - 1.8 ng/dL  TSH  Result Value Ref Range   TSH 8.61 (H) 0.40 - 4.50 mIU/L      Assessment & Plan:   Problem List Items Addressed This Visit    Adult hypothyroidism - Primary    Mild elevated TSH recent >8 but normal Free T4 Chronic hypothyroidism with fluctuation before, recently stable Not followed by Endocrine  Plan 1. 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, low weight and reduced muscle mass clinically - Encourage improve higher calorie / high protein diet, may use ensure boost supplement regularly, given samples - Follow-up in future may need nutrition      Senile osteoporosis    Secondary to age among other co morbid conditions, scleroderma on treatment Followed by Rheumatology Dr Imogene Burn Last DEXA 2017 OP T-3.5 and will have repeat soon She has declined treatment for OP in past from their office, but she will reconsider, I encouraged  to consider these options such as Prolia or other treatment       Other Visit Diagnoses    Adjustment disorder with depressed mood        Secondary to recent life stressor with less involved with her grandchildren due to them moving, seems able to function still very well and good support system - Reassurance given - Decline medication or other therapy now - Follow-up   No orders of the defined types were placed in this encounter.   Follow up plan: Return in about 3 months (around 11/26/2017) for Hypothyroid lab results / energy / Rheum - osteoporosis.  Future labs ordered for 11/2017 for TSH Free T4. She may notify us if she wishes to add Vitamin testing with Vitamin D and B12, she has declined this option today.  Saralyn Pilar, DO [facility] 08/26/2017, 12:14 PM

## 2017-08-26 NOTE — Assessment & Plan Note (Signed)
Secondary to age among other co morbid conditions, scleroderma on treatment Followed by Rheumatology Dr Imogene Burn Last DEXA 2017 OP T-3.5 and will have repeat soon She has declined treatment for OP in past from their office, but she will reconsider, I encouraged to consider these options such as Prolia or other treatment

## 2017-08-30 ENCOUNTER — Telehealth: Payer: Self-pay | Admitting: Family Medicine

## 2017-08-30 DIAGNOSIS — E039 Hypothyroidism, unspecified: Secondary | ICD-10-CM

## 2017-08-30 NOTE — Telephone Encounter (Signed)
Call placed to patient.  No answer. LM on voicemail to return call for clarification of symptoms tomorrow as it is now after hours. As discussed at last visit with Dr. Althea Charon no symptoms clinically indicated for changing levothyroxine dose.  Agree that TSH supports change, but only if symptoms present.   Will defer to Dr. Kirtland Bouchard to address on 08/31/2017 when he returns to office.

## 2017-08-30 NOTE — Telephone Encounter (Signed)
Pt decided she wants to change dosage of levothyroxine.  Please send to [facility].

## 2017-08-31 MED ORDER — LEVOTHYROXINE SODIUM 88 MCG PO TABS: 88.0000 ug | ORAL_TABLET | Freq: Every day | ORAL | 5 refills | Status: DC

## 2017-08-31 NOTE — Telephone Encounter (Signed)
Patient advised.

## 2017-08-31 NOTE — Telephone Encounter (Signed)
Confirmed with patient / husband today that she does request higher dose from Levothyroxine daily up to daily due to reported symptoms of irritability and generally not feeling quite as well controlled.  Sent new rx daily for 30 day supply with refills.  Saralyn Pilar, DO [facility] 08/31/2017, 8:30 AM

## 2017-09-14 ENCOUNTER — Ambulatory Visit
Admission: RE | Admit: 2017-09-14 | Discharge: 2017-09-14 | Disposition: A | Discharge status: Home / Self Care | Payer: Medicare Other | Source: Ambulatory Visit | Attending: Internal Medicine | Admitting: Internal Medicine

## 2017-09-14 DIAGNOSIS — M818 Other osteoporosis without current pathological fracture: Secondary | ICD-10-CM | POA: Diagnosis not present

## 2017-09-21 LAB — THYROID PANEL
CRP: 1.6 (ref ?–8.0)
ERYTHROCYTE SED RATE: 51 — AB (ref ?–30)
Free T4: 1.4 (ref 0.8–1.8)
TSH: 1.48 (ref 0.4–4.5)

## 2017-09-28 ENCOUNTER — Telehealth: Payer: Self-pay | Admitting: Family Medicine

## 2017-09-28 NOTE — Telephone Encounter (Signed)
We do not currently have any results.  Depending on when she was seen by Dr. Imogene Burn, it may take a few more days.

## 2017-09-28 NOTE — Telephone Encounter (Signed)
Pt asked if Dr Imogene Burn faxed over lab results.  Please call [phone]

## 2017-09-28 NOTE — Telephone Encounter (Signed)
The pt was notified. No questions or concerns. 

## 2017-09-30 ENCOUNTER — Encounter: Payer: Self-pay | Admitting: Family Medicine

## 2017-09-30 NOTE — Telephone Encounter (Signed)
Patient informed about her dosage and has no further question. Her question was about the dosage only.

## 2017-09-30 NOTE — Telephone Encounter (Signed)
Received fax from [facility] Rhe[facility] 9/17. Last visit with me in 08/2017 we adjusted her Levothyroxine dose inc from 75 up to 88 due to elevated TSH. Now results show improved and normalized TSH and Free T4. She should continue dose of Levothyroxine daily.  I am not exactly sure what result specifically she needed me to review. I do not see her leaving that information for [place]. I assume she is drawing my attention to the Thyroid labs. I will abstract these results into her chart.  Saralyn Pilar, DO [facility] 09/30/2017, 8:12 AM

## 2017-10-29 ENCOUNTER — Encounter: Payer: Self-pay | Admitting: Family Medicine

## 2017-11-13 MED ORDER — GENERIC EXTERNAL MEDICATION
2.00 | Status: DC
Start: 2017-11-13 — End: 2017-11-13

## 2017-11-13 MED ORDER — GENERIC EXTERNAL MEDICATION
5.00 | Status: DC
Start: 2017-11-14 — End: 2017-11-13

## 2017-11-13 MED ORDER — CHLORHEXIDINE GLUCONATE 0.12 % MT SOLN
15.00 | OROMUCOSAL | Status: DC
Start: 2017-11-13 — End: 2017-11-13

## 2017-11-13 MED ORDER — SODIUM CHLORIDE 0.9 % IN NEBU
3.00 | INHALATION_SOLUTION | RESPIRATORY_TRACT | Status: DC
Start: ? — End: 2017-11-13

## 2017-11-13 MED ORDER — DOCUSATE SODIUM 150 MG/15ML PO LIQD
100.00 | ORAL | Status: DC
Start: 2017-11-13 — End: 2017-11-13

## 2017-11-13 MED ORDER — MAGNESIUM OXIDE 400 MG PO TABS
400.00 | ORAL_TABLET | ORAL | Status: DC
Start: 2017-11-14 — End: 2017-11-13

## 2017-11-13 MED ORDER — ACETAMINOPHEN 325 MG PO TABS
650.00 | ORAL_TABLET | ORAL | Status: DC
Start: ? — End: 2017-11-13

## 2017-11-13 MED ORDER — HEPARIN SODIUM (PORCINE) 5000 UNIT/ML IJ SOLN
5000.00 | INTRAMUSCULAR | Status: DC
Start: 2017-11-13 — End: 2017-11-13

## 2017-11-13 MED ORDER — HYDROXYCHLOROQUINE SULFATE 200 MG PO TABS
200.00 | ORAL_TABLET | ORAL | Status: DC
Start: 2017-11-13 — End: 2017-11-13

## 2017-11-13 MED ORDER — LEVOTHYROXINE SODIUM 88 MCG PO TABS
88.00 | ORAL_TABLET | ORAL | Status: DC
Start: 2017-11-14 — End: 2017-11-13

## 2017-11-13 MED ORDER — GENERIC EXTERNAL MEDICATION
5.00 | Status: DC
Start: ? — End: 2017-11-13

## 2017-11-13 MED ORDER — IPRATROPIUM BROMIDE 0.02 % IN SOLN
500.00 | RESPIRATORY_TRACT | Status: DC
Start: 2017-11-13 — End: 2017-11-13

## 2017-11-13 MED ORDER — DEXTROSE 10 % IV SOLN
12.50 | INTRAVENOUS | Status: DC
Start: ? — End: 2017-11-13

## 2017-11-13 MED ORDER — ALBUTEROL SULFATE (2.5 MG/3ML) 0.083% IN NEBU
5.00 | INHALATION_SOLUTION | RESPIRATORY_TRACT | Status: DC
Start: 2017-11-13 — End: 2017-11-13

## 2017-11-13 MED ORDER — FAMOTIDINE 40 MG/5ML PO SUSR
20.00 | ORAL | Status: DC
Start: 2017-11-13 — End: 2017-11-13

## 2017-11-22 ENCOUNTER — Other Ambulatory Visit: Payer: Medicare Other

## 2017-11-30 ENCOUNTER — Ambulatory Visit: Payer: Medicare Other | Admitting: Family Medicine

## 2017-12-05 MED ORDER — LEVOTHYROXINE SODIUM 88 MCG PO TABS
88.00 | ORAL_TABLET | ORAL | Status: DC
Start: 2017-12-06 — End: 2017-12-05

## 2017-12-05 MED ORDER — AMLODIPINE BESYLATE 5 MG PO TABS
5.00 | ORAL_TABLET | ORAL | Status: DC
Start: 2017-12-06 — End: 2017-12-05

## 2017-12-05 MED ORDER — CHLORHEXIDINE GLUCONATE 0.12 % MT SOLN
15.00 | OROMUCOSAL | Status: DC
Start: 2017-12-05 — End: 2017-12-05

## 2017-12-05 MED ORDER — OXYCODONE HCL 5 MG/5ML PO SOLN
5.00 | ORAL | Status: DC
Start: ? — End: 2017-12-05

## 2017-12-05 MED ORDER — LOPERAMIDE HCL 1 MG/7.5ML PO LIQD
2.00 | ORAL | Status: DC
Start: ? — End: 2017-12-05

## 2017-12-05 MED ORDER — HYDROXYCHLOROQUINE SULFATE 200 MG PO TABS
200.00 | ORAL_TABLET | ORAL | Status: DC
Start: 2017-12-05 — End: 2017-12-05

## 2017-12-05 MED ORDER — HEPARIN SODIUM (PORCINE) 5000 UNIT/ML IJ SOLN
5000.00 | INTRAMUSCULAR | Status: DC
Start: 2017-12-05 — End: 2017-12-05

## 2017-12-05 MED ORDER — ACETAMINOPHEN 650 MG/20.3ML PO SOLN
650.00 | ORAL | Status: DC
Start: ? — End: 2017-12-05

## 2017-12-05 MED ORDER — AMOXICILLIN-POT CLAVULANATE 875-125 MG PO TABS
1.00 | ORAL_TABLET | ORAL | Status: DC
Start: 2017-12-05 — End: 2017-12-05

## 2017-12-09 ENCOUNTER — Other Ambulatory Visit: Payer: Self-pay | Admitting: Family Medicine

## 2017-12-09 DIAGNOSIS — I10 Essential (primary) hypertension: Secondary | ICD-10-CM

## 2017-12-21 ENCOUNTER — Telehealth: Payer: Self-pay | Admitting: Family Medicine

## 2017-12-21 NOTE — Telephone Encounter (Signed)
Spoke to pt to schedule awv would prefer to wait until later in the Spring after she has completed Radiation treatments. knb

## 2018-02-23 ENCOUNTER — Other Ambulatory Visit: Payer: Self-pay | Admitting: Family Medicine

## 2018-02-23 DIAGNOSIS — E039 Hypothyroidism, unspecified: Secondary | ICD-10-CM

## 2018-06-08 ENCOUNTER — Telehealth: Payer: Self-pay

## 2018-06-08 NOTE — Telephone Encounter (Signed)
Pt husband call complaining that his wife is having issues SOB that worsen over the past 3 days. Her breathing is labored, but not severe.  He states that she has been having issues with  post nasal drainage and coughing that he associates w/ the drainage x 4 days. Denies any contact of anyone diagnose or suspected to have COVID19. No issues with fever, chills, diarrhea or sore throat. 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 He's concern of possible pneumonia. I recommended that the patient go to the Urgent Care or ER so they can do stat labs and Xray.

## 2018-06-08 NOTE — Telephone Encounter (Signed)
Recommend more immediate care at [facility] ED or Urgent Care where patient can have immediate imaging as needed and work-up.  No available appointments today or tomorrow and based on this patient's complexity, I would recommend next level of care. We do not have access to X-ray in our office currently due to coronavirus pandemic changes.  Saralyn Pilar, DO [facility] 06/08/2018, 9:49 AM

## 2018-06-21 ENCOUNTER — Encounter: Payer: Self-pay | Admitting: Family Medicine

## 2018-06-22 MED ORDER — LIQUIGEN PO
0.00 | ORAL | Status: DC
Start: ? — End: 2018-06-22

## 2018-06-22 MED ORDER — LOVENOX 150 MG/ML ~~LOC~~ SOLN
4.00 | SUBCUTANEOUS | Status: DC
Start: ? — End: 2018-06-22

## 2018-06-22 MED ORDER — ONDANSETRON HCL 4 MG/2ML IJ SOLN
4.00 | INTRAMUSCULAR | Status: DC
Start: ? — End: 2018-06-22

## 2018-06-22 MED ORDER — Medication
0.04 | Status: DC
Start: ? — End: 2018-06-22

## 2018-06-22 MED ORDER — EQL SHEER SPOTS SMALL MISC
0.00 | Status: DC
Start: ? — End: 2018-06-22

## 2018-06-22 MED ORDER — SODIUM CHLORIDE 0.9 % IV SOLN
10.00 | INTRAVENOUS | Status: DC
Start: ? — End: 2018-06-22

## 2018-06-22 MED ORDER — Medication
Status: DC
Start: ? — End: 2018-06-22

## 2018-06-22 MED ORDER — QUETIAPINE FUMARATE 25 MG PO TABS
50.00 | ORAL_TABLET | ORAL | Status: DC
Start: 2018-06-22 — End: 2018-06-22

## 2018-06-22 MED ORDER — BL TUSSIN CF 30-10-100 MG/5ML PO SYRP
2.00 | ORAL_SOLUTION | ORAL | Status: DC
Start: ? — End: 2018-06-22

## 2018-06-22 MED ORDER — TETRACAINE HCL (LOCAL ANESTHETICS - ESTERS)
300.00 | Status: DC
Start: 2018-06-22 — End: 2018-06-22

## 2018-07-13 NOTE — Progress Notes (Signed)
Received fax on 25-Jun-2018 from [facility] with discharge summary details of recent hospitalization and patient now deceased.  Admit 06/12/2018 Discharged (Deceased) June 25, 2018 - at 6:35PM  Events prior to death were necrotizing pneumonia and ventilator support. Case not referred to medical examiner. Autopsy not performed.  Pulmonary ICU attending Dr Evelena Leyden  Scanned documents received with detailed [facility] hospital course and discharge summary. - Briefly admitted with acute respiratory distress and concerns of pneumonia, had hypoxia, and failed oxygen support therapy, escalated to mechanical ventilation, had bronchosocopy, complicated by pneumothorax, resolved by chest tube, received broad spectrum IV antibiotics Zosyn Linezolid, attempts at weaning sedation and ventilation were difficult. Ultimately she was initiated on comfort care 25-Jun-2018 due to unable to wean off ventilator.  Nobie Putnam, [facility] 06/27/2018, 6:21 PM

## 2018-07-13 DEATH — deceased
